# Patient Record
Sex: Male | Born: 1955 | Race: White | Hispanic: No
Health system: Midwestern US, Academic
[De-identification: ages and names within clinical notes are randomized; demographics above are authoritative.]

---

## 2016-11-04 ENCOUNTER — Encounter: Admit: 2016-11-04 | Discharge: 2016-11-04 | Payer: MEDICARE

## 2016-11-04 MED ORDER — EZETIMIBE 10 MG PO TAB
ORAL_TABLET | Freq: Every day | 1 refills | Status: AC
Start: 2016-11-04 — End: 2016-11-11

## 2016-11-04 MED ORDER — CARVEDILOL 12.5 MG PO TAB
12.5 mg | ORAL_TABLET | Freq: Two times a day (BID) | ORAL | 3 refills | 90.00000 days | Status: AC
Start: 2016-11-04 — End: 2016-12-26

## 2016-11-04 NOTE — Telephone Encounter
Received a fax from the patients local pharmacy requesting a refill. Rx sent as requested by the local pharmacy

## 2016-11-06 ENCOUNTER — Encounter: Admit: 2016-11-06 | Discharge: 2016-11-06 | Payer: MEDICARE

## 2016-11-11 ENCOUNTER — Encounter: Admit: 2016-11-11 | Discharge: 2016-11-11 | Payer: MEDICARE

## 2016-11-11 MED ORDER — EZETIMIBE 10 MG PO TAB
ORAL_TABLET | Freq: Every day | 1 refills | Status: AC
Start: 2016-11-11 — End: 2017-08-06

## 2016-11-12 LAB — COMPREHENSIVE METABOLIC PANEL
Lab: 142 — ABNORMAL LOW (ref 4.70–6.10)
Lab: 15
Lab: 15 — ABNORMAL HIGH (ref 0–14)
Lab: 18
Lab: 37
Lab: 42

## 2016-11-12 LAB — CBC: Lab: 5.4

## 2016-11-12 LAB — LIPID PROFILE
Lab: 210 — ABNORMAL HIGH (ref 150–200)
Lab: 50

## 2016-11-12 LAB — HEMOGLOBIN A1C: Lab: 7.6 — ABNORMAL HIGH (ref 4.5–6.5)

## 2016-12-10 ENCOUNTER — Encounter: Admit: 2016-12-10 | Discharge: 2016-12-10 | Payer: MEDICARE

## 2016-12-10 NOTE — Telephone Encounter
-----   Message from Rosaria Ferries, LPN sent at 09/15/3974  2:41 PM CDT -----  Regarding: Remote check  Patient has follow up appointment on 12/26/16 in [place] with Dr. Tyson Alias. Patient's last remote check was February 2018. Can we get a current remote check on this patient before the scheduled follow up appointment? Thank you!

## 2016-12-10 NOTE — Telephone Encounter
Pt has been having problems with his home device transmitter.  He has been working with the company and they have made the decision to replace the machine.  It will take about 7-10 days and he wanted us to know.     I told him I would notify the device team so they are aware.

## 2016-12-10 NOTE — Telephone Encounter
Contacted pt for disconnected monitor list. A letter was sent 11/06/16 with no response. [facility]  [facility]. Pt discovered that his monitor has been unplugged. Re plugged in and will send in a transmission after the handset recharges. Dates shows 07/04/16 on monitor. Asked him to call after he had sent in a transmission. He has printed instructions as well as on screen prompts. I gave him [employer] tech support # [phone] if has trouble sending in. He will call [phone] to let us know when it is sent in.

## 2016-12-17 ENCOUNTER — Ambulatory Visit: Admit: 2016-12-17 | Discharge: 2016-12-17 | Payer: MEDICARE

## 2016-12-17 ENCOUNTER — Encounter: Admit: 2016-12-17 | Discharge: 2016-12-17 | Payer: MEDICARE

## 2016-12-17 DIAGNOSIS — Z95810 Presence of automatic (implantable) cardiac defibrillator: ICD-10-CM

## 2016-12-17 DIAGNOSIS — I426 Alcoholic cardiomyopathy: ICD-10-CM

## 2016-12-26 ENCOUNTER — Encounter: Admit: 2016-12-26 | Discharge: 2016-12-26 | Payer: MEDICARE

## 2016-12-26 ENCOUNTER — Ambulatory Visit: Admit: 2016-12-26 | Discharge: 2016-12-27 | Payer: MEDICARE

## 2016-12-26 DIAGNOSIS — I10 Essential (primary) hypertension: ICD-10-CM

## 2016-12-26 DIAGNOSIS — E785 Hyperlipidemia, unspecified: ICD-10-CM

## 2016-12-26 DIAGNOSIS — E119 Type 2 diabetes mellitus without complications: ICD-10-CM

## 2016-12-26 DIAGNOSIS — I509 Heart failure, unspecified: Secondary | ICD-10-CM

## 2016-12-26 DIAGNOSIS — R569 Unspecified convulsions: ICD-10-CM

## 2016-12-26 DIAGNOSIS — G988 Other disorders of nervous system: ICD-10-CM

## 2016-12-26 DIAGNOSIS — Z95810 Presence of automatic (implantable) cardiac defibrillator: Principal | ICD-10-CM

## 2016-12-26 DIAGNOSIS — E349 Endocrine disorder, unspecified: ICD-10-CM

## 2016-12-26 DIAGNOSIS — G40909 Epilepsy, unspecified, not intractable, without status epilepticus: ICD-10-CM

## 2016-12-26 DIAGNOSIS — I426 Alcoholic cardiomyopathy: ICD-10-CM

## 2016-12-26 DIAGNOSIS — Z950 Presence of cardiac pacemaker: ICD-10-CM

## 2016-12-26 DIAGNOSIS — N183 Chronic kidney disease, stage 3 (moderate): ICD-10-CM

## 2016-12-26 DIAGNOSIS — I2510 Atherosclerotic heart disease of native coronary artery without angina pectoris: ICD-10-CM

## 2016-12-26 DIAGNOSIS — G4730 Sleep apnea, unspecified: ICD-10-CM

## 2016-12-26 DIAGNOSIS — F329 Major depressive disorder, single episode, unspecified: ICD-10-CM

## 2016-12-26 MED ORDER — LOSARTAN 25 MG PO TAB
12.5 mg | ORAL_TABLET | Freq: Every day | ORAL | 3 refills | 30.00000 days | Status: AC
Start: 2016-12-26 — End: 2016-12-27

## 2016-12-26 MED ORDER — CARVEDILOL 12.5 MG PO TAB
6.25 mg | ORAL_TABLET | Freq: Two times a day (BID) | ORAL | 3 refills | 90.00000 days | Status: AC
Start: 2016-12-26 — End: 2016-12-27

## 2016-12-26 NOTE — Assessment & Plan Note
Lab Results   Component Value Date    CHOL 210 (H) 11/12/2016    TRIG 187 11/12/2016    HDL 50 11/12/2016    LDL 106 (H) 11/12/2016    VLDL 37 11/12/2016    CHOLHDLC 4 11/12/2016      LDL pretty good on current medication.

## 2016-12-26 NOTE — Assessment & Plan Note
Remote transmission last week shows normal function, almost 100% bi-V pacing.

## 2016-12-26 NOTE — Assessment & Plan Note
He's having trouble with postural light headedness and has fallen several times.  He fractured his left foot during one fall during the night.  I'm lowering his anti-hypertensive medication dosages.

## 2016-12-26 NOTE — Assessment & Plan Note
Last EF was 25% in early 2017.  No heart failure manifestations currently.

## 2016-12-26 NOTE — Progress Notes
Date of Service: 12/26/2016    Manuel Houston is a 61 y.o. male.       HPI     Manuel Houston was in the [facility] clinic today for follow-up regarding cardiomyopathy and bi-V ICD.  He has had recurring problems with postural light headedness and has had several falls.  I'm going to lower his BP medication dosages.    His device check last week doesn't indicate any arrhythmias.  Device function is normal with nearly 100% bi-V pacing.    He denies any chest pain, dyspnea, or palpitations.  He hasn't had syncope.         Vitals:    12/26/16 0835 12/26/16 0844   BP: 116/70 110/68   Pulse: 96    Weight: 73.9 kg (163 lb)    Height: 1.676 m (5' 6)      Body mass index is 26.31 kg/m???.     Past Medical History  Patient Active Problem List    Diagnosis Date Noted   ??? Right arm pain 09/20/2015   ??? Weight loss 06/15/2015   ??? CKD (chronic kidney disease) stage 3, GFR 30-59 ml/min (HCC) 11/07/2010   ??? Carotid atherosclerosis 05/01/2010     03/22/10 - Carotid Duplex:  Minimal plaque in the internal carotid arteries bilaterally.  (RICA 1.61 m/sec, LICA 0.83 m/sec)     ??? 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        1998 - Possible coronary arteriogram, [facility] (no records, questionable historian)        10/05 - Echo doppler: EF 25%, LVH, inferior akinesis, mild MR, bi-atrial enlargement.         2/06 - Permanent disability status pending.        4/06 - EF 10-15% via left ventriculogram.  01/2015 - RVG EF 36%     ??? Coronary atherosclerosis 03/27/2009       3/06 - Stress thallium.        4/06 - Cardiac cath:  dilated cardiomyopathy, minimal mid-distal LAD plaquing. EF 10-15%.     ??? 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        2004 - Upgrade to biventricular device: Medtronic InSync III Phylliss Bob, [facility]).  2012 - Upgrade to bi-V ICD     ??? Essential hypertension 03/27/2009 2002 - Diagnosis established.     ??? Hyperlipidemia 03/27/2009   ??? Sleep apnea 03/27/2009      2002 - CPAP machine prescribed     ??? DM type 2 (diabetes mellitus, type 2) (HCC) 03/27/2009      2002 - Diagnosis established.        Followed by Dr. Threasa Beards in [place]     ??? Glaucoma 03/27/2009     1996 - Left eye removed surgically.     ??? Seizure disorder (HCC) 03/27/2009     Idiopathic Seizure Disorder       2002 - Onset.       12/05 - Last seizure (can't operate automobile).       Followed by Dr. Sharon Seller (neurologist in [place]).     ??? 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   HENT: Negative.  Eyes: Negative.    Cardiovascular: Negative.    Respiratory: Negative.    Endocrine: Negative.    Hematologic/Lymphatic: Negative.    Skin: Negative.    Musculoskeletal: Negative.    Gastrointestinal: Negative.    Genitourinary: Negative.    Neurological: Negative.    Psychiatric/Behavioral: Negative.    Allergic/Immunologic: Negative.        Physical Exam    Physical Exam   General Appearance: no distress   Skin: warm, no ulcers or xanthomas   Digits and Nails: no cyanosis or clubbing   Eyes: conjunctivae and lids normal, pupils are equal and round   Teeth/Gums/Palate: dentition unremarkable, no lesions   Lips & Oral Mucosa: no pallor or cyanosis   Neck Veins: normal JVP , neck veins are not distended   Thyroid: no nodules, masses, tenderness or enlargement   Chest Inspection: chest is normal in appearance   Respiratory Effort: breathing comfortably, no respiratory distress   Auscultation/Percussion: lungs clear to auscultation, no rales or rhonchi, no wheezing   PMI: PMI not enlarged or displaced   Cardiac Rhythm: regular rhythm and normal rate   Cardiac Auscultation: S1, S2 normal, no rub, no gallop   Murmurs: no murmur Peripheral Circulation: normal peripheral circulation   Carotid Arteries: normal carotid upstroke bilaterally, no bruits   Radial Arteries: normal symmetric radial pulses   Abdominal Aorta: no abdominal aortic bruit   Pedal Pulses: normal symmetric pedal pulses   Lower Extremity Edema: no lower extremity edema   Abdominal Exam: soft, non-tender, no masses, bowel sounds normal   Liver & Spleen: no organomegaly   Gait & Station: walks without assistance   Muscle Strength: normal muscle tone   Orientation: oriented to time, place and person   Affect & Mood: appropriate and sustained affect   Language and Memory: patient responsive and seems to comprehend information   Neurologic Exam: neurological assessment grossly intact   Other: moves all extremities        Problems Addressed Today  Encounter Diagnoses   Name Primary?   ??? Biventricular implantable cardioverter-defibrillator in situ    ??? Alcoholic cardiomyopathy (HCC)    ??? Hyperlipidemia, unspecified hyperlipidemia type    ??? Essential hypertension        Assessment and Plan       Biventricular implantable cardioverter-defibrillator in situ  Remote transmission last week shows normal function, almost 100% bi-V pacing.    Alcoholic cardiomyopathy (HCC)  Last EF was 25% in early 2017.  No heart failure manifestations currently.    Hyperlipidemia  Lab Results   Component Value Date    CHOL 210 (H) 11/12/2016    TRIG 187 11/12/2016    HDL 50 11/12/2016    LDL 106 (H) 11/12/2016    VLDL 37 11/12/2016    CHOLHDLC 4 11/12/2016      LDL pretty good on current medication.    Essential hypertension  He's having trouble with postural light headedness and has fallen several times.  He fractured his left foot during one fall during the night.  I'm lowering his anti-hypertensive medication dosages.      Current Medications (including today's revisions)  ??? allopurinol (ZYLOPRIM) 300 mg tablet Take 300 mg by mouth daily.     ??? aspirin EC 325 mg tablet Take 1 Tab by mouth daily. ??? carvedilol (COREG) 12.5 mg tablet Take one-half tablet by mouth twice daily.   ??? cholecalciferol(+) (VITAMIN D-3) 2,000 unit tablet Take  by mouth at bedtime daily.   ???  colchicine 0.6 mg tablet Take 0.6 mg by mouth daily.   ??? diclofenac sodium (VOLTAREN-XR) 100 mg xr tablet Take 1 tablet by mouth daily.   ??? duloxetine DR (CYMBALTA) 60 mg capsule Take 60 mg by mouth daily.   ??? eszopiclone(+) (LUNESTA) 1 mg tablet Take 1 mg by mouth.   ??? ezetimibe (ZETIA) 10 mg tablet Take one tablet by mouth once daily with simvastatin   ??? furosemide (LASIX) 40 mg PO Tab take 40 mg by mouth Daily.    ??? insulin detemir(+) (LEVEMIR) 100 unit/mL soln Inject 15 Units under the skin daily.   ??? levothyroxine (SYNTHROID) 50 mcg tablet Take 50 mcg by mouth daily.   ??? liraglutide(+) (VICTOZA) 0.6 mg/0.1 mL (18 mg/3 mL) pnij Inject 0.6 mg under the skin daily.   ??? losartan (COZAAR) 25 mg tablet Take one-half tablet by mouth daily.   ??? magnesium oxide (MAG-OX) 400 mg tablet Take 1 tablet by mouth twice daily.   ??? melatonin(+) 3 mg PO Tab Take 6 mg by mouth at bedtime daily.   ??? Omega-3 Fatty Acids-Vitamin E (FISH OIL) 1,000 mg PO Cap Take  by mouth Twice Daily.   ??? oxyCODONE-acetaminophen (PERCOCET; ENDOCET; ROXICET) 5-325 mg tablet Take 1-2 Tabs by mouth every 4 hours as needed for Pain   ??? phenytoin SR (DILANTIN EXTENDED) 100 mg capsule Take 100mg  by mouth twice daily alternating with 100mg  by mouth in the morning and 200mg  in the evening every other day.   ??? potassium chloride SR (K-DUR) 10 mEq tablet Take 10 mEq by mouth twice daily.     ??? sertraline (ZOLOFT) 100 mg PO Tab take 100 mg by mouth At Bedtime Daily.    ??? simvastatin (ZOCOR) 80 mg tablet Take 1 tablet by mouth at bedtime daily.   ??? topiramate (TOPAMAX) 50 mg PO tablet take 150 mg by mouth Twice Daily.    ??? zolpidem (AMBIEN) 10 mg tablet Take 10 mg by mouth at bedtime as needed for Sleep.

## 2016-12-27 ENCOUNTER — Encounter: Admit: 2016-12-27 | Discharge: 2016-12-27 | Payer: MEDICARE

## 2016-12-27 MED ORDER — LOSARTAN 25 MG PO TAB
12.5 mg | ORAL_TABLET | Freq: Every day | ORAL | 3 refills | 30.00000 days | Status: AC
Start: 2016-12-27 — End: 2017-02-18

## 2016-12-27 MED ORDER — CARVEDILOL 6.25 MG PO TAB
6.25 mg | ORAL_TABLET | Freq: Two times a day (BID) | ORAL | 3 refills | 90.00000 days | Status: AC
Start: 2016-12-27 — End: 2017-02-18

## 2017-02-18 ENCOUNTER — Encounter: Admit: 2017-02-18 | Discharge: 2017-02-18 | Payer: MEDICARE

## 2017-02-18 MED ORDER — LOSARTAN 25 MG PO TAB
12.5 mg | ORAL_TABLET | Freq: Every day | ORAL | 3 refills | 30.00000 days | Status: AC
Start: 2017-02-18 — End: 2017-07-17

## 2017-02-18 MED ORDER — CARVEDILOL 6.25 MG PO TAB
6.25 mg | ORAL_TABLET | Freq: Two times a day (BID) | ORAL | 3 refills | 90.00000 days | Status: AC
Start: 2017-02-18 — End: 2017-08-06

## 2017-02-19 ENCOUNTER — Encounter: Admit: 2017-02-19 | Discharge: 2017-02-19 | Payer: MEDICARE

## 2017-02-19 NOTE — Telephone Encounter
Spoke to [employer] at [employer] [id number]) re: reference # [id number] [phone] to clarify doses of carvedilol and losartan. Both medications were reduced at last OV with Dr. Danella Maiers [facility]) on 12/26/16. Read back verification of current doses completed with pharmacist [facility]. No further needs identified at this time.

## 2017-03-19 ENCOUNTER — Ambulatory Visit: Admit: 2017-03-19 | Discharge: 2017-03-20 | Payer: MEDICARE

## 2017-03-19 DIAGNOSIS — I426 Alcoholic cardiomyopathy: Principal | ICD-10-CM

## 2017-03-20 DIAGNOSIS — Z95810 Presence of automatic (implantable) cardiac defibrillator: ICD-10-CM

## 2017-05-07 ENCOUNTER — Encounter: Admit: 2017-05-07 | Discharge: 2017-05-07 | Payer: MEDICARE

## 2017-05-07 NOTE — Telephone Encounter
Pt had general questions about home monitoring. All questions answered and pt happy with f/u.

## 2017-05-07 NOTE — Telephone Encounter
-----   Message from Tyrone SageMeghan Adams sent at 05/07/2017  9:34 AM CST -----  Regarding: Pt is requesting a call  Pt has a question and is requesting a call back.  He can be reached at [phone]. thanks

## 2017-05-09 LAB — BASIC METABOLIC PANEL
Lab: 1.8 — ABNORMAL HIGH (ref 0.72–1.25)
Lab: 10 — ABNORMAL HIGH (ref 8.8–10.0)
Lab: 100 — ABNORMAL LOW (ref 42.0–52.0)
Lab: 143 — ABNORMAL LOW (ref 4.70–6.10)
Lab: 18 — ABNORMAL HIGH (ref 0–14)
Lab: 187 — ABNORMAL HIGH (ref 80–115)
Lab: 27 — ABNORMAL HIGH (ref 8.4–25.7)
Lab: 29
Lab: 4.4 — ABNORMAL LOW (ref 14.0–18.0)

## 2017-05-09 LAB — CBC: Lab: 4.9

## 2017-06-18 ENCOUNTER — Ambulatory Visit: Admit: 2017-06-18 | Discharge: 2017-06-19 | Payer: MEDICARE

## 2017-06-18 DIAGNOSIS — I426 Alcoholic cardiomyopathy: Principal | ICD-10-CM

## 2017-06-19 DIAGNOSIS — Z95810 Presence of automatic (implantable) cardiac defibrillator: ICD-10-CM

## 2017-07-01 ENCOUNTER — Encounter: Admit: 2017-07-01 | Discharge: 2017-07-01 | Payer: MEDICARE

## 2017-07-17 ENCOUNTER — Ambulatory Visit: Admit: 2017-07-17 | Discharge: 2017-07-18 | Payer: MEDICARE

## 2017-07-17 ENCOUNTER — Encounter: Admit: 2017-07-17 | Discharge: 2017-07-17 | Payer: MEDICARE

## 2017-07-17 DIAGNOSIS — I509 Heart failure, unspecified: Secondary | ICD-10-CM

## 2017-07-17 DIAGNOSIS — G4730 Sleep apnea, unspecified: ICD-10-CM

## 2017-07-17 DIAGNOSIS — F329 Major depressive disorder, single episode, unspecified: ICD-10-CM

## 2017-07-17 DIAGNOSIS — E785 Hyperlipidemia, unspecified: ICD-10-CM

## 2017-07-17 DIAGNOSIS — Z95810 Presence of automatic (implantable) cardiac defibrillator: Principal | ICD-10-CM

## 2017-07-17 DIAGNOSIS — I2510 Atherosclerotic heart disease of native coronary artery without angina pectoris: Secondary | ICD-10-CM

## 2017-07-17 DIAGNOSIS — I10 Essential (primary) hypertension: ICD-10-CM

## 2017-07-17 DIAGNOSIS — G988 Other disorders of nervous system: ICD-10-CM

## 2017-07-17 DIAGNOSIS — E119 Type 2 diabetes mellitus without complications: ICD-10-CM

## 2017-07-17 DIAGNOSIS — I426 Alcoholic cardiomyopathy: ICD-10-CM

## 2017-07-17 DIAGNOSIS — G40909 Epilepsy, unspecified, not intractable, without status epilepticus: ICD-10-CM

## 2017-07-17 DIAGNOSIS — R569 Unspecified convulsions: ICD-10-CM

## 2017-07-17 DIAGNOSIS — N183 Chronic kidney disease, stage 3 (moderate): ICD-10-CM

## 2017-07-17 DIAGNOSIS — E349 Endocrine disorder, unspecified: ICD-10-CM

## 2017-07-17 DIAGNOSIS — Z950 Presence of cardiac pacemaker: ICD-10-CM

## 2017-07-17 MED ORDER — ASPIRIN 81 MG PO TBEC
81 mg | ORAL_TABLET | Freq: Every day | ORAL | 3 refills | Status: AC
Start: 2017-07-17 — End: ?

## 2017-07-17 MED ORDER — CANDESARTAN 4 MG PO TAB
4 mg | ORAL_TABLET | Freq: Every day | ORAL | 3 refills | Status: AC
Start: 2017-07-17 — End: 2017-08-06

## 2017-08-06 ENCOUNTER — Encounter: Admit: 2017-08-06 | Discharge: 2017-08-06 | Payer: MEDICARE

## 2017-08-06 MED ORDER — EZETIMIBE 10 MG PO TAB
ORAL_TABLET | Freq: Every day | 3 refills | Status: AC
Start: 2017-08-06 — End: 2018-06-15

## 2017-08-06 MED ORDER — CARVEDILOL 6.25 MG PO TAB
6.25 mg | ORAL_TABLET | Freq: Two times a day (BID) | ORAL | 3 refills | 90.00000 days | Status: AC
Start: 2017-08-06 — End: 2018-06-15

## 2017-08-06 MED ORDER — MAGNESIUM OXIDE 400 MG (241.3 MG MAGNESIUM) PO TAB
400 mg | ORAL_TABLET | Freq: Two times a day (BID) | ORAL | 3 refills | Status: AC
Start: 2017-08-06 — End: 2018-06-15

## 2017-08-06 MED ORDER — SIMVASTATIN 80 MG PO TAB
80 mg | ORAL_TABLET | Freq: Every evening | ORAL | 3 refills | Status: AC
Start: 2017-08-06 — End: 2018-06-15

## 2017-08-06 MED ORDER — CANDESARTAN 4 MG PO TAB
4 mg | ORAL_TABLET | Freq: Every day | ORAL | 3 refills | Status: AC
Start: 2017-08-06 — End: 2018-06-15

## 2017-08-14 ENCOUNTER — Encounter: Admit: 2017-08-14 | Discharge: 2017-08-14 | Payer: MEDICARE

## 2017-08-14 DIAGNOSIS — G4730 Sleep apnea, unspecified: ICD-10-CM

## 2017-08-14 DIAGNOSIS — I10 Essential (primary) hypertension: Secondary | ICD-10-CM

## 2017-08-14 DIAGNOSIS — E349 Endocrine disorder, unspecified: ICD-10-CM

## 2017-08-14 DIAGNOSIS — E785 Hyperlipidemia, unspecified: ICD-10-CM

## 2017-08-14 DIAGNOSIS — I509 Heart failure, unspecified: Secondary | ICD-10-CM

## 2017-08-14 DIAGNOSIS — G40909 Epilepsy, unspecified, not intractable, without status epilepticus: ICD-10-CM

## 2017-08-14 DIAGNOSIS — R569 Unspecified convulsions: ICD-10-CM

## 2017-08-14 DIAGNOSIS — F329 Major depressive disorder, single episode, unspecified: ICD-10-CM

## 2017-08-14 DIAGNOSIS — G988 Other disorders of nervous system: ICD-10-CM

## 2017-08-14 DIAGNOSIS — N183 Chronic kidney disease, stage 3 (moderate): ICD-10-CM

## 2017-08-14 DIAGNOSIS — E119 Type 2 diabetes mellitus without complications: ICD-10-CM

## 2017-08-14 DIAGNOSIS — Z950 Presence of cardiac pacemaker: ICD-10-CM

## 2017-08-14 DIAGNOSIS — I2510 Atherosclerotic heart disease of native coronary artery without angina pectoris: Principal | ICD-10-CM

## 2017-09-17 ENCOUNTER — Ambulatory Visit: Admit: 2017-09-17 | Discharge: 2017-09-18 | Payer: MEDICARE

## 2017-09-18 DIAGNOSIS — Z95810 Presence of automatic (implantable) cardiac defibrillator: ICD-10-CM

## 2017-09-18 DIAGNOSIS — I426 Alcoholic cardiomyopathy: Principal | ICD-10-CM

## 2017-12-17 ENCOUNTER — Ambulatory Visit: Admit: 2017-12-17 | Discharge: 2017-12-18 | Payer: MEDICARE

## 2017-12-18 DIAGNOSIS — Z95810 Presence of automatic (implantable) cardiac defibrillator: ICD-10-CM

## 2017-12-18 DIAGNOSIS — I426 Alcoholic cardiomyopathy: Principal | ICD-10-CM

## 2017-12-23 LAB — LIPID PROFILE
Lab: 134
Lab: 153 — ABNORMAL LOW (ref 42.0–52.0)
Lab: 27 — ABNORMAL HIGH (ref 80–115)
Lab: 3
Lab: 50
Lab: 66 — ABNORMAL HIGH (ref 0.72–1.25)

## 2017-12-23 LAB — HEMOGLOBIN A1C: Lab: 6.8 — ABNORMAL HIGH (ref 4.5–6.5)

## 2017-12-23 LAB — CBC: Lab: 5.2

## 2017-12-23 LAB — COMPREHENSIVE METABOLIC PANEL
Lab: 0.3
Lab: 144 — ABNORMAL LOW (ref 4.70–6.10)

## 2018-01-22 ENCOUNTER — Encounter: Admit: 2018-01-22 | Discharge: 2018-01-22 | Payer: MEDICARE

## 2018-03-18 ENCOUNTER — Ambulatory Visit: Admit: 2018-03-18 | Discharge: 2018-03-19 | Payer: MEDICARE

## 2018-03-19 DIAGNOSIS — Z95810 Presence of automatic (implantable) cardiac defibrillator: Principal | ICD-10-CM

## 2018-03-19 DIAGNOSIS — I426 Alcoholic cardiomyopathy: ICD-10-CM

## 2018-06-15 ENCOUNTER — Encounter: Admit: 2018-06-15 | Discharge: 2018-06-15 | Payer: MEDICARE

## 2018-06-15 MED ORDER — SIMVASTATIN 80 MG PO TAB
80 mg | ORAL_TABLET | Freq: Every evening | ORAL | 3 refills | Status: AC
Start: 2018-06-15 — End: 2018-06-18

## 2018-06-15 MED ORDER — MAGNESIUM OXIDE 400 MG (241.3 MG MAGNESIUM) PO TAB
400 mg | ORAL_TABLET | Freq: Two times a day (BID) | ORAL | 3 refills | Status: AC
Start: 2018-06-15 — End: 2019-05-28

## 2018-06-15 MED ORDER — CARVEDILOL 6.25 MG PO TAB
6.25 mg | ORAL_TABLET | Freq: Two times a day (BID) | ORAL | 3 refills | 90.00000 days | Status: AC
Start: 2018-06-15 — End: 2019-05-19

## 2018-06-15 MED ORDER — CANDESARTAN 4 MG PO TAB
4 mg | ORAL_TABLET | Freq: Every day | ORAL | 3 refills | Status: AC
Start: 2018-06-15 — End: 2018-10-29

## 2018-06-15 MED ORDER — EZETIMIBE 10 MG PO TAB
ORAL_TABLET | Freq: Every day | 3 refills | Status: AC
Start: 2018-06-15 — End: 2019-05-19

## 2018-06-17 ENCOUNTER — Ambulatory Visit: Admit: 2018-06-17 | Discharge: 2018-06-17 | Payer: MEDICARE

## 2018-06-17 DIAGNOSIS — I426 Alcoholic cardiomyopathy: Principal | ICD-10-CM

## 2018-06-17 DIAGNOSIS — Z95810 Presence of automatic (implantable) cardiac defibrillator: ICD-10-CM

## 2018-06-18 ENCOUNTER — Encounter: Admit: 2018-06-18 | Discharge: 2018-06-18 | Payer: MEDICARE

## 2018-06-18 MED ORDER — SIMVASTATIN 80 MG PO TAB
80 mg | ORAL_TABLET | Freq: Every evening | ORAL | 3 refills | Status: AC
Start: 2018-06-18 — End: 2018-07-29

## 2018-06-23 ENCOUNTER — Ambulatory Visit: Admit: 2018-06-23 | Discharge: 2018-06-24 | Payer: MEDICARE

## 2018-06-23 ENCOUNTER — Encounter: Admit: 2018-06-23 | Discharge: 2018-06-23 | Payer: MEDICARE

## 2018-06-23 DIAGNOSIS — Z95810 Presence of automatic (implantable) cardiac defibrillator: Principal | ICD-10-CM

## 2018-06-23 DIAGNOSIS — I426 Alcoholic cardiomyopathy: ICD-10-CM

## 2018-07-14 ENCOUNTER — Encounter: Admit: 2018-07-14 | Discharge: 2018-07-14 | Payer: MEDICARE

## 2018-07-27 ENCOUNTER — Encounter: Admit: 2018-07-27 | Discharge: 2018-07-27 | Payer: MEDICARE

## 2018-07-27 NOTE — Telephone Encounter
[facility] [place] team - please contact pt. Was scheduled for [facility] on 3[place]17 [place], [facility]ens not in clinic, rescheduled to 4/16 instead - please call - has device questions.

## 2018-07-29 ENCOUNTER — Encounter: Admit: 2018-07-29 | Discharge: 2018-07-29 | Payer: MEDICARE

## 2018-07-29 MED ORDER — SIMVASTATIN 80 MG PO TAB
80 mg | ORAL_TABLET | Freq: Every evening | ORAL | 1 refills | Status: AC
Start: 2018-07-29 — End: 2018-08-28

## 2018-07-29 NOTE — Telephone Encounter
Called and left message requesting callback to answer questions.

## 2018-08-18 ENCOUNTER — Encounter: Admit: 2018-08-18 | Discharge: 2018-08-18 | Payer: MEDICARE

## 2018-08-19 ENCOUNTER — Encounter: Admit: 2018-08-19 | Discharge: 2018-08-19 | Payer: MEDICARE

## 2018-08-21 ENCOUNTER — Encounter: Admit: 2018-08-21 | Discharge: 2018-08-21 | Payer: MEDICARE

## 2018-08-21 MED ORDER — ISOSORBIDE MONONITRATE 30 MG PO TB24
30 mg | ORAL_TABLET | Freq: Every morning | ORAL | 1 refills | 30.00000 days | Status: DC
Start: 2018-08-21 — End: 2018-10-29

## 2018-08-21 NOTE — Telephone Encounter
Pt called back and states that he is still having chest pain.  He states that it is up around his pacemaker.  He has not had taken any NTG lately, but says the pain is just kind of always there.  Discussed imdur with patient  He is willing to try it and see if it makes any difference.  He requested that we send in a weeks upply to his pharmacy and he will callback to let us know if the medication helps.      Sent script to pharmacy.

## 2018-08-21 NOTE — Telephone Encounter
Left message and requested callback to follow up on symptoms

## 2018-08-21 NOTE — Telephone Encounter
-----   Message from Weston Brass sent at 08/19/2018 12:46 PM CDT -----  Regarding: Call Friday  Please call patient and ask about chest discomfort see below.    We left it that I'd ask Brett Canales to check back with Manuel Houston on Friday.  If he's still having occasionally episodes and especially if he's used a NTG with good response, we might add Imdur 30 mg/day to Jahmeir's medication program.

## 2018-08-24 ENCOUNTER — Encounter: Admit: 2018-08-24 | Discharge: 2018-08-24 | Payer: MEDICARE

## 2018-08-24 MED ORDER — OMEPRAZOLE 20 MG PO CPDR
ORAL_CAPSULE | Freq: Every morning | 1 refills | Status: DC
Start: 2018-08-24 — End: 2019-06-24

## 2018-08-24 NOTE — Telephone Encounter
Called patient back and left message with recommendations. Rx sent ot pharmacy.     Vanice Sarah, MD  Allen Norris, RN    Sure, let's have him start 40 mg/day omeprazole and, if it's helping, he can cut back to 20 mg/day in 2-3 weeks. ???Thanks.

## 2018-08-25 NOTE — Telephone Encounter
Pt called back to let us know he received message.  Patient will call back in about 7-10 days if omeprazole doesn't help symptoms. No further needs identified at this time.

## 2018-08-28 ENCOUNTER — Encounter: Admit: 2018-08-28 | Discharge: 2018-08-28 | Payer: MEDICARE

## 2018-08-28 MED ORDER — SIMVASTATIN 80 MG PO TAB
80 mg | ORAL_TABLET | Freq: Every evening | ORAL | 1 refills | Status: DC
Start: 2018-08-28 — End: 2019-02-27

## 2018-08-31 ENCOUNTER — Encounter: Admit: 2018-08-31 | Discharge: 2018-08-31 | Payer: MEDICARE

## 2018-09-02 ENCOUNTER — Encounter: Admit: 2018-09-02 | Discharge: 2018-09-02 | Payer: MEDICARE

## 2018-09-02 DIAGNOSIS — I426 Alcoholic cardiomyopathy: ICD-10-CM

## 2018-09-02 DIAGNOSIS — R079 Chest pain, unspecified: ICD-10-CM

## 2018-09-02 DIAGNOSIS — I10 Essential (primary) hypertension: Principal | ICD-10-CM

## 2018-09-08 ENCOUNTER — Ambulatory Visit: Admit: 2018-09-08 | Discharge: 2018-09-09 | Payer: MEDICARE

## 2018-09-08 ENCOUNTER — Encounter: Admit: 2018-09-08 | Discharge: 2018-09-08 | Payer: MEDICARE

## 2018-09-08 DIAGNOSIS — R0789 Other chest pain: Principal | ICD-10-CM

## 2018-09-10 ENCOUNTER — Encounter: Admit: 2018-09-10 | Discharge: 2018-09-10 | Payer: MEDICARE

## 2018-09-16 ENCOUNTER — Encounter: Admit: 2018-09-16 | Discharge: 2018-09-16 | Payer: MEDICARE

## 2018-09-16 ENCOUNTER — Ambulatory Visit: Admit: 2018-09-16 | Discharge: 2018-09-16 | Payer: MEDICARE

## 2018-09-16 DIAGNOSIS — I426 Alcoholic cardiomyopathy: Principal | ICD-10-CM

## 2018-09-16 DIAGNOSIS — Z95810 Presence of automatic (implantable) cardiac defibrillator: Secondary | ICD-10-CM

## 2018-10-29 ENCOUNTER — Ambulatory Visit: Admit: 2018-10-29 | Discharge: 2018-10-30

## 2018-10-29 ENCOUNTER — Encounter: Admit: 2018-10-29 | Discharge: 2018-10-29

## 2018-10-29 DIAGNOSIS — I2510 Atherosclerotic heart disease of native coronary artery without angina pectoris: Secondary | ICD-10-CM

## 2018-10-29 DIAGNOSIS — N183 Chronic kidney disease, stage 3 (moderate): Secondary | ICD-10-CM

## 2018-10-29 DIAGNOSIS — E119 Type 2 diabetes mellitus without complications: Secondary | ICD-10-CM

## 2018-10-29 DIAGNOSIS — F329 Major depressive disorder, single episode, unspecified: Secondary | ICD-10-CM

## 2018-10-29 DIAGNOSIS — I509 Heart failure, unspecified: Secondary | ICD-10-CM

## 2018-10-29 DIAGNOSIS — G988 Other disorders of nervous system: Secondary | ICD-10-CM

## 2018-10-29 DIAGNOSIS — I10 Essential (primary) hypertension: Secondary | ICD-10-CM

## 2018-10-29 DIAGNOSIS — Z95810 Presence of automatic (implantable) cardiac defibrillator: Secondary | ICD-10-CM

## 2018-10-29 DIAGNOSIS — I426 Alcoholic cardiomyopathy: Secondary | ICD-10-CM

## 2018-10-29 DIAGNOSIS — E785 Hyperlipidemia, unspecified: Secondary | ICD-10-CM

## 2018-10-29 DIAGNOSIS — G4730 Sleep apnea, unspecified: Secondary | ICD-10-CM

## 2018-10-29 DIAGNOSIS — G40909 Epilepsy, unspecified, not intractable, without status epilepticus: Secondary | ICD-10-CM

## 2018-10-29 DIAGNOSIS — Z950 Presence of cardiac pacemaker: Secondary | ICD-10-CM

## 2018-10-29 DIAGNOSIS — R569 Unspecified convulsions: Secondary | ICD-10-CM

## 2018-10-29 DIAGNOSIS — E349 Endocrine disorder, unspecified: Secondary | ICD-10-CM

## 2018-10-29 MED ORDER — CANDESARTAN 4 MG PO TAB
2 mg | ORAL_TABLET | Freq: Every day | ORAL | 3 refills | Status: DC
Start: 2018-10-29 — End: 2019-06-09

## 2018-10-29 NOTE — Assessment & Plan Note
Lab Results   Component Value Date    CHOL 156 06/18/2018    TRIG 134 06/18/2018    HDL 51 06/18/2018    LDL 63 06/18/2018    VLDL 27 06/18/2018    CHOLHDLC 3 06/18/2018      LDL treated to goal.

## 2018-10-29 NOTE — Assessment & Plan Note
Remote transmission a month ago shows normal function.  No therapies.

## 2018-10-29 NOTE — Assessment & Plan Note
EF 20% on RVG from April.  Non-ischemic MPI.  No HF manifestations currently.

## 2018-10-29 NOTE — Assessment & Plan Note
I'm reducing candesartan to 2 mg/day.

## 2018-10-29 NOTE — Progress Notes
Date of Service: 10/29/2018    Manuel Houston is a 63 y.o. male.       HPI     Manuel Houston was in the [place] clinic today for follow-up regarding cardiomyopathy and ICD.  From a cardiovascular standpoint he seems to be doing fine but I am concerned about the fact that he has had about a 10 to 15 pound unexplained weight loss.  He is on metformin and the dose is being worked up but he is not having diarrhea so I am not real sure that the metformin is the entire answer.  He is having a lot of pelvic discomfort and I think more clears is planning cystoscopy in the near future.  Manuel Houston's GI work-up seems to be negative so far and his upper endoscopy reportedly showed no particular pathology.    From a cardiac standpoint he denies any problems with chest discomfort or breathlessness.  He has had no palpitations or lightheadedness.  He had a remote transmission about a month ago that showed normal device function.  He has had real good benefit from biventricular pacing although his ejection fraction is still only 20% on RVG last April.         Vitals:    10/29/18 0848 10/29/18 0850   BP: 94/64 92/62   BP Source: Arm, Left Upper Arm, Right Upper   Pulse: 96    SpO2: 98%    Weight: 69.6 kg (153 lb 6.4 oz)    Height: 1.676 m (5' 6)    PainSc: Three      Body mass index is 24.76 kg/m???.     Past Medical History  Patient Active Problem List    Diagnosis Date Noted   ??? Right arm pain 09/20/2015   ??? Weight loss 06/15/2015   ??? CKD (chronic kidney disease) stage 3, GFR 30-59 ml/min (HCC) 11/07/2010   ??? Carotid atherosclerosis 05/01/2010     03/22/10 - Carotid Duplex:  Minimal plaque in the internal carotid arteries bilaterally.  (RICA 1.61 m/sec, LICA 0.83 m/sec)     ??? 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        1998 - Possible coronary arteriogram, [facility] (no records, questionable historian)        10/05 - Echo doppler: EF 25%, LVH, inferior akinesis, mild MR, bi-atrial enlargement.         2/06 - Permanent disability status pending.        4/06 - EF 10-15% via left ventriculogram.  01/2015 - RVG EF 36%     ??? Coronary atherosclerosis 03/27/2009       3/06 - Stress thallium.        4/06 - Cardiac cath:  dilated cardiomyopathy, minimal mid-distal LAD plaquing. EF 10-15%.     ??? 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        2004 - Upgrade to biventricular device: Medtronic InSync III Phylliss Bob, [facility]).  2012 - Upgrade to bi-V ICD     ??? Essential hypertension 03/27/2009       2002 - Diagnosis established.     ??? Hyperlipidemia 03/27/2009   ??? Sleep apnea 03/27/2009      2002 - CPAP machine prescribed     ??? DM type 2 (diabetes mellitus, type 2) (HCC) 03/27/2009      2002 - Diagnosis established.  Followed by Dr. Threasa Beards in [place]     ??? Glaucoma 03/27/2009     1996 - Left eye removed surgically.     ??? Seizure disorder (HCC) 03/27/2009     Idiopathic Seizure Disorder       2002 - Onset.       12/05 - Last seizure (can't operate automobile).       Followed by Dr. Sharon Seller (neurologist in [place]).     ??? 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   HENT: Negative.    Eyes: Negative.    Cardiovascular: Negative.    Respiratory: Negative.    Endocrine: Negative.    Hematologic/Lymphatic: Negative.    Skin: Negative.    Musculoskeletal: Negative.    Gastrointestinal: Negative.    Genitourinary: Negative.    Neurological: Negative.    Psychiatric/Behavioral: Negative.    Allergic/Immunologic: Negative.        Physical Exam    Physical Exam   General Appearance: no distress   Skin: warm, no ulcers or xanthomas   Digits and Nails: no cyanosis or clubbing Eyes: conjunctivae and lids normal, pupils are equal and round   Teeth/Gums/Palate: dentition unremarkable, no lesions   Lips & Oral Mucosa: no pallor or cyanosis   Neck Veins: normal JVP , neck veins are not distended   Thyroid: no nodules, masses, tenderness or enlargement   Chest Inspection: chest is normal in appearance   Respiratory Effort: breathing comfortably, no respiratory distress   Auscultation/Percussion: lungs clear to auscultation, no rales or rhonchi, no wheezing   PMI: PMI not enlarged or displaced   Cardiac Rhythm: regular rhythm and normal rate   Cardiac Auscultation: S1, S2 normal, no rub, no gallop   Murmurs: no murmur   Peripheral Circulation: normal peripheral circulation   Carotid Arteries: normal carotid upstroke bilaterally, no bruits   Radial Arteries: normal symmetric radial pulses   Abdominal Aorta: no abdominal aortic bruit   Pedal Pulses: normal symmetric pedal pulses   Lower Extremity Edema: no lower extremity edema   Abdominal Exam: soft, non-tender, no masses, bowel sounds normal   Liver & Spleen: no organomegaly   Gait & Station: walks without assistance   Muscle Strength: normal muscle tone   Orientation: oriented to time, place and person   Affect & Mood: appropriate and sustained affect   Language and Memory: patient responsive and seems to comprehend information   Neurologic Exam: neurological assessment grossly intact   Other: moves all extremities      Problems Addressed Today  Encounter Diagnoses   Name Primary?   ??? Hyperlipidemia, unspecified hyperlipidemia type    ??? Essential hypertension    ??? Alcoholic cardiomyopathy (HCC)    ??? Biventricular implantable cardioverter-defibrillator in situ        Assessment and Plan       Hyperlipidemia  Lab Results   Component Value Date    CHOL 156 06/18/2018    TRIG 134 06/18/2018    HDL 51 06/18/2018    LDL 63 06/18/2018    VLDL 27 06/18/2018    CHOLHDLC 3 06/18/2018      LDL treated to goal.    Essential hypertension I'm reducing candesartan to 2 mg/day.    Alcoholic cardiomyopathy (HCC)  EF 20% on RVG from  April.  Non-ischemic MPI.  No HF manifestations currently.    Biventricular implantable cardioverter-defibrillator in situ  Remote transmission a month ago shows normal function.  No therapies.      Current Medications (including today's revisions)  ??? allopurinol (ZYLOPRIM) 300 mg tablet Take 300 mg by mouth daily.     ??? aspirin EC 81 mg tablet Take one tablet by mouth daily. Take with food.   ??? calcium carbonate/vitamin D3 (CALCIUM 600 + D PO) Take  by mouth.   ??? candesartan (ATACAND) 4 mg tablet Take one-half tablet by mouth daily.   ??? carvediloL (COREG) 6.25 mg tablet Take one tablet by mouth twice daily. Take with food.   ??? cholecalciferol(+) (VITAMIN D-3) 2,000 unit tablet Take  by mouth at bedtime daily.   ??? diclofenac sodium (VOLTAREN-XR) 100 mg xr tablet Take 1 tablet by mouth daily.   ??? ezetimibe (ZETIA) 10 mg tablet Take one tablet by mouth once daily with simvastatin   ??? furosemide (LASIX) 40 mg PO Tab take 40 mg by mouth Daily.    ??? hydroxychloroquine (PLAQUENIL) 200 mg tablet Take 400 mg by mouth daily. Take with food.   ??? levothyroxine (SYNTHROID) 50 mcg tablet Take 50 mcg by mouth daily.   ??? liraglutide(+) (VICTOZA) 0.6 mg/0.1 mL (18 mg/3 mL) pnij Inject 0.6 mg under the skin daily.   ??? magnesium oxide (MAG-OX) 400 mg (241.3 mg magnesium) tablet Take one tablet by mouth twice daily.   ??? melatonin 10 mg tab Take 10 mg by mouth at bedtime daily.   ??? metFORMIN (GLUCOPHAGE) 1,000 mg tablet Take 1,000 mg by mouth twice daily with meals.   ??? Omega-3 Fatty Acids-Vitamin E (FISH OIL) 1,000 mg PO Cap Take  by mouth Twice Daily.   ??? omeprazole DR (PRILOSEC) 20 mg capsule Take 2 tabs every morning before breakfast for 2 weeks, then one tab daily thereafter.   ??? oxyCODONE-acetaminophen (PERCOCET; ENDOCET; ROXICET) 5-325 mg tablet Take 1-2 Tabs by mouth every 4 hours as needed for Pain ??? phenytoin SR (DILANTIN EXTENDED) 100 mg capsule Take 100mg  by mouth twice daily alternating with 100mg  by mouth in the morning and 200mg  in the evening every other day.   ??? potassium chloride (K-TAB) 20 mEq tablet Take 20 mEq by mouth twice daily.   ??? sertraline (ZOLOFT) 100 mg PO Tab Take 200 mg by mouth at bedtime daily.   ??? simvastatin (ZOCOR) 80 mg tablet Take one tablet by mouth at bedtime daily.   ??? topiramate (TOPAMAX) 50 mg PO tablet take 150 mg by mouth Twice Daily.    ??? zolpidem (AMBIEN) 10 mg tablet Take 10 mg by mouth at bedtime as needed for Sleep.

## 2018-12-16 ENCOUNTER — Encounter: Admit: 2018-12-16 | Discharge: 2018-12-16

## 2018-12-16 ENCOUNTER — Ambulatory Visit: Admit: 2018-12-16 | Discharge: 2018-12-17

## 2018-12-16 DIAGNOSIS — Z95810 Presence of automatic (implantable) cardiac defibrillator: Secondary | ICD-10-CM

## 2018-12-16 DIAGNOSIS — I426 Alcoholic cardiomyopathy: Secondary | ICD-10-CM

## 2018-12-29 ENCOUNTER — Encounter: Admit: 2018-12-29 | Discharge: 2018-12-29 | Payer: MEDICARE

## 2018-12-29 NOTE — Telephone Encounter
Called and discussed symptoms with patient.  He denies any weight gain.  He has noticed some shortness of breath when he has been vacuuming, but states that it is not that often.  He states that it might not be anything new.  He hasn't been using his sleep apnea machine at night as he states that it has been irritating his nose.  He will start wearing his sleep mask tonight.  He is scheduled to see Dr. Marcello Moores tomorrow for routine follow up.    Will route to Dr. Ricard Dillon for any additional recommendations.

## 2018-12-29 NOTE — Telephone Encounter
-----   Message from Buckner Malta sent at 12/29/2018 10:57 AM CDT -----  Regarding: Optivol suggesting fluid, SDO Patient  Good morning,    Patient had remote on 12/16/2018 that showed OptiVol elevating which may suggest some fluid accumulation.  Please see attached. Thank you.

## 2019-01-01 NOTE — Telephone Encounter
Manuel Cowboy, MD  Baldwin Crown, RN    Caller: Unspecified (3 days ago, 11:17 AM)              Probably nothing to do different. His bi-V pacing was almost 100% and he's on furosemide--suspect it's an OptiVol false alarm.

## 2019-02-01 ENCOUNTER — Encounter: Admit: 2019-02-01 | Discharge: 2019-02-01 | Payer: MEDICARE

## 2019-02-27 ENCOUNTER — Encounter: Admit: 2019-02-27 | Discharge: 2019-02-27 | Payer: MEDICARE

## 2019-02-27 MED ORDER — SIMVASTATIN 80 MG PO TAB
ORAL_TABLET | Freq: Every evening | 1 refills | Status: AC
Start: 2019-02-27 — End: ?

## 2019-04-23 ENCOUNTER — Encounter: Admit: 2019-04-23 | Discharge: 2019-04-23 | Payer: MEDICARE

## 2019-04-29 ENCOUNTER — Encounter: Admit: 2019-04-29 | Discharge: 2019-04-29 | Payer: MEDICARE

## 2019-04-29 DIAGNOSIS — I10 Essential (primary) hypertension: Secondary | ICD-10-CM

## 2019-04-29 DIAGNOSIS — G40909 Epilepsy, unspecified, not intractable, without status epilepticus: Secondary | ICD-10-CM

## 2019-04-29 DIAGNOSIS — I2510 Atherosclerotic heart disease of native coronary artery without angina pectoris: Secondary | ICD-10-CM

## 2019-04-29 DIAGNOSIS — G4730 Sleep apnea, unspecified: Secondary | ICD-10-CM

## 2019-04-29 DIAGNOSIS — R569 Unspecified convulsions: Secondary | ICD-10-CM

## 2019-04-29 DIAGNOSIS — Z950 Presence of cardiac pacemaker: Secondary | ICD-10-CM

## 2019-04-29 DIAGNOSIS — N183 CKD (chronic kidney disease) stage 3, GFR 30-59 ml/min: Secondary | ICD-10-CM

## 2019-04-29 DIAGNOSIS — E119 Type 2 diabetes mellitus without complications: Secondary | ICD-10-CM

## 2019-04-29 DIAGNOSIS — I5022 Chronic systolic (congestive) heart failure: Secondary | ICD-10-CM

## 2019-04-29 DIAGNOSIS — I426 Alcoholic cardiomyopathy: Secondary | ICD-10-CM

## 2019-04-29 DIAGNOSIS — F329 Major depressive disorder, single episode, unspecified: Secondary | ICD-10-CM

## 2019-04-29 DIAGNOSIS — G988 Other disorders of nervous system: Secondary | ICD-10-CM

## 2019-04-29 DIAGNOSIS — E785 Hyperlipidemia, unspecified: Secondary | ICD-10-CM

## 2019-04-29 DIAGNOSIS — Z95810 Presence of automatic (implantable) cardiac defibrillator: Secondary | ICD-10-CM

## 2019-04-29 DIAGNOSIS — I509 Heart failure, unspecified: Secondary | ICD-10-CM

## 2019-04-29 DIAGNOSIS — E349 Endocrine disorder, unspecified: Secondary | ICD-10-CM

## 2019-04-29 NOTE — Assessment & Plan Note
He had a nonischemic stress test in April.

## 2019-04-29 NOTE — Assessment & Plan Note
Ejection fraction was 20% on a regadenoson thallium in April, 2020.  He has a biventricular pacemaker that has significantly improved his symptoms status.  He is currently class II heart failure symptoms.

## 2019-04-29 NOTE — Assessment & Plan Note
Lab Results   Component Value Date    CHOL 156 06/18/2018    TRIG 134 06/18/2018    HDL 51 06/18/2018    LDL 63 06/18/2018    VLDL 27 06/18/2018    CHOLHDLC 3 06/18/2018      LDL treated to goal.

## 2019-04-29 NOTE — Progress Notes
Date of Service: 04/29/2019    Manuel Houston is a 63 y.o. male.       HPI     Manuel Houston was in the [place] [facility] clinic today for follow-up regarding ICD and systolic heart failure.  He's getting tired of the pandemic restrictions, but seems OK from a CV standpoint.      He gets tired some time when cleaning is place, but denies any chest discomfort or dyspnea.  He's using his BIPAP and doesn't sleep particularly well.  He takes 10 mg Ambien and says that it doesn't seem to make him sleepy!  He's also using melatonin.    His 60 and 74 year old grandchildren are coming over for him to watch 2 or 3 times/week.  His daughter is working at [employer], but after the virus went through the center.      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  He's going to see Dr. Aundria Rud for this.    Dr. Maisie Fus is managing his diabetes.  Manuel Houston does fingersticks and his levels have ranged from the 80's to 150's.  His renal function is stable at around creatinine = 2.  He sees Dr. Thornell Sartorius for anything male to be he's peeing alright.  I think he's following Gaetan's renal function as well.         Vitals:    04/29/19 0811 04/29/19 0814   BP: 102/76 104/74   BP Source: Arm, Left Upper Arm, Right Upper   Patient Position: Sitting Sitting   Pulse: 85    Temp: 36.4 ?C (97.5 ?F)    TempSrc: Oral    SpO2: 98%    Weight: 71 kg (156 lb 9.6 oz)    Height: 1.676 m (5' 6)    PainSc: Zero      Body mass index is 25.28 kg/m?Marland Kitchen     Past Medical History  Patient Active Problem List    Diagnosis Date Noted   ? Right arm pain 09/20/2015   ? Weight loss 06/15/2015   ? CKD (chronic kidney disease) stage 3, GFR 30-59 ml/min 11/07/2010   ? Carotid atherosclerosis 05/01/2010     03/22/10 - Carotid Duplex:  Minimal plaque in the internal carotid arteries bilaterally.  (RICA 5.78 m/sec, LICA 0.83 m/sec)     ? Alcoholic cardiomyopathy (HCC) 46/96/2952      1998 - Diagnosis after presentation with chest pain . 1998 - Possible coronary arteriogram, [facility] (no records, questionable historian)        10/05 - Echo doppler: EF 25%, LVH, inferior akinesis, mild MR, bi-atrial enlargement.         2/06 - Permanent disability status pending.        4/06 - EF 10-15% via left ventriculogram.  01/2015 - RVG EF 36%     ? Coronary atherosclerosis 03/27/2009       3/06 - Stress thallium.        4/06 - Cardiac cath:  dilated cardiomyopathy, minimal mid-distal LAD plaquing. EF 10-15%.     ? Biventricular implantable cardioverter-defibrillator in situ 03/27/2009       2002 - Permanent pacemaker: Phylliss Bob, [place]).        2004 - Upgrade to biventricular device: Medtronic InSync III Phylliss Bob, [facility]).  2012 - Upgrade to bi-V ICD     ? Essential hypertension 03/27/2009       2002 - Diagnosis established.     ?  Hyperlipidemia 03/27/2009   ? Sleep apnea 03/27/2009      2002 - CPAP machine prescribed     ? DM type 2 (diabetes mellitus, type 2) (HCC) 03/27/2009      2002 - Diagnosis established.        Followed by Dr. Threasa Beards in [place]     ? Glaucoma 03/27/2009     1996 - Left eye removed surgically.     ? Seizure disorder (HCC) 03/27/2009     Idiopathic Seizure Disorder       2002 - Onset.       12/05 - Last seizure (can't operate automobile).       Followed by Dr. Sharon Seller (neurologist in [place]).     ? 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   HENT: Negative.    Eyes: Negative.    Cardiovascular: Negative.    Respiratory: Positive for sleep disturbances due to breathing.    Endocrine: Negative.    Hematologic/Lymphatic: Negative.    Skin: Negative.    Musculoskeletal: Positive for arthritis, back pain, joint pain, muscle cramps, muscle weakness, myalgias, neck pain and stiffness.   Gastrointestinal: Negative.    Genitourinary: Negative.    Neurological: Negative. Psychiatric/Behavioral: The patient has insomnia.    Allergic/Immunologic: Negative.        Physical Exam    Physical Exam   General Appearance: no distress   Skin: warm, no ulcers or xanthomas   Digits and Nails: no cyanosis or clubbing   Eyes: conjunctivae and lids normal, pupils are equal and round   Teeth/Gums/Palate: dentition unremarkable, no lesions   Lips & Oral Mucosa: no pallor or cyanosis   Neck Veins: normal JVP , neck veins are not distended   Thyroid: no nodules, masses, tenderness or enlargement   Chest Inspection: chest is normal in appearance   Respiratory Effort: breathing comfortably, no respiratory distress   Auscultation/Percussion: lungs clear to auscultation, no rales or rhonchi, no wheezing   PMI: PMI not enlarged or displaced   Cardiac Rhythm: regular rhythm and normal rate   Cardiac Auscultation: S1, S2 normal, no rub, no gallop   Murmurs: no murmur   Peripheral Circulation: normal peripheral circulation   Carotid Arteries: normal carotid upstroke bilaterally, no bruits   Radial Arteries: normal symmetric radial pulses   Abdominal Aorta: no abdominal aortic bruit   Pedal Pulses: normal symmetric pedal pulses   Lower Extremity Edema: no lower extremity edema   Abdominal Exam: soft, non-tender, no masses, bowel sounds normal   Liver & Spleen: no organomegaly   Gait & Station: walks without assistance   Muscle Strength: normal muscle tone   Orientation: oriented to time, place and person   Affect & Mood: appropriate and sustained affect   Language and Memory: patient responsive and seems to comprehend information   Neurologic Exam: neurological assessment grossly intact   Other: moves all extremities      Problems Addressed Today  Encounter Diagnoses   Name Primary?   ? Chronic systolic heart failure (HCC) Yes   ? Biventricular implantable cardioverter-defibrillator in situ    ? Essential hypertension    ? Alcoholic cardiomyopathy (HCC)    ? Hyperlipidemia, unspecified hyperlipidemia type ? Atherosclerosis of coronary artery of native heart without angina pectoris, unspecified vessel or lesion type  Assessment and Plan       Essential hypertension  BP looks fine current medical program.    Alcoholic cardiomyopathy (HCC)  Ejection fraction was 20% on a regadenoson thallium in April, 2020.  He has a biventricular pacemaker that has significantly improved his symptoms status.  He is currently class II heart failure symptoms.    Hyperlipidemia  Lab Results   Component Value Date    CHOL 156 06/18/2018    TRIG 134 06/18/2018    HDL 51 06/18/2018    LDL 63 06/18/2018    VLDL 27 06/18/2018    CHOLHDLC 3 06/18/2018      LDL treated to goal.    Coronary atherosclerosis  He had a nonischemic stress test in April.    Biventricular implantable cardioverter-defibrillator in situ  He is due for an in office device check in February.  He had a remote transmission in August showing nearly 100% biventricular pacing.  He has a little over 2 years of expected battery longevity.      Current Medications (including today's revisions)  ? allopurinol (ZYLOPRIM) 300 mg tablet Take 300 mg by mouth daily.     ? aspirin EC 81 mg tablet Take one tablet by mouth daily. Take with food.   ? calcium carbonate/vitamin D3 (CALCIUM 600 + D PO) Take  by mouth twice daily.   ? candesartan (ATACAND) 4 mg tablet Take one-half tablet by mouth daily.   ? carvediloL (COREG) 6.25 mg tablet Take one tablet by mouth twice daily. Take with food.   ? cetirizine (ALLERGY RELIEF (CETIRIZINE)) 10 mg tablet Take 10 mg by mouth every morning.   ? cholecalciferol(+) (VITAMIN D-3) 2,000 unit tablet Take  by mouth at bedtime daily.   ? diclofenac (VOLTAREN) 1 % topical gel Apply 4 g topically to affected area three times daily.   ? ezetimibe (ZETIA) 10 mg tablet Take one tablet by mouth once daily with simvastatin   ? furosemide (LASIX) 40 mg PO Tab take 40 mg by mouth Daily. ? hydroxychloroquine (PLAQUENIL) 200 mg tablet Take 400 mg by mouth daily. Take with food.   ? levothyroxine (SYNTHROID) 50 mcg tablet Take 50 mcg by mouth daily.   ? liraglutide(+) (VICTOZA) 0.6 mg/0.1 mL (18 mg/3 mL) pnij Inject 1.2 mg under the skin daily.   ? magnesium oxide (MAG-OX) 400 mg (241.3 mg magnesium) tablet Take one tablet by mouth twice daily.   ? melatonin 10 mg tab Take 10 mg by mouth at bedtime daily.   ? Omega-3 Fatty Acids-Vitamin E (FISH OIL) 1,000 mg PO Cap Take 1,000 mg by mouth daily.   ? omeprazole DR (PRILOSEC) 20 mg capsule Take 2 tabs every morning before breakfast for 2 weeks, then one tab daily thereafter.   ? phenytoin SR (DILANTIN EXTENDED) 100 mg capsule Take 100mg  by mouth twice daily alternating with 100mg  by mouth in the morning and 200mg  in the evening every other day.   ? potassium chloride (K-TAB) 20 mEq tablet Take 20 mEq by mouth twice daily.   ? sertraline (ZOLOFT) 100 mg PO Tab Take 200 mg by mouth at bedtime daily.   ? simvastatin (ZOCOR) 80 mg tablet TAKE 1 TABLET AT BEDTIME   DAILY   ? topiramate (TOPAMAX) 50 mg PO tablet take 150 mg by mouth Twice Daily.    ? zolpidem (AMBIEN) 10 mg tablet Take 10 mg by mouth at bedtime as needed for Sleep.

## 2019-04-29 NOTE — Assessment & Plan Note
He is due for an in office device check in February.  He had a remote transmission in August showing nearly 100% biventricular pacing.  He has a little over 2 years of expected battery longevity.

## 2019-04-29 NOTE — Assessment & Plan Note
BP looks fine current medical program.

## 2019-05-19 MED ORDER — CARVEDILOL 6.25 MG PO TAB
ORAL_TABLET | Freq: Two times a day (BID) | ORAL | 3 refills | 90.00000 days | Status: AC
Start: 2019-05-19 — End: ?

## 2019-05-19 MED ORDER — EZETIMIBE 10 MG PO TAB
ORAL_TABLET | Freq: Every day | 3 refills | Status: AC
Start: 2019-05-19 — End: ?

## 2019-05-22 ENCOUNTER — Ambulatory Visit: Admit: 2019-05-22 | Discharge: 2019-05-22 | Payer: MEDICARE

## 2019-05-22 DIAGNOSIS — Z95810 Presence of automatic (implantable) cardiac defibrillator: Secondary | ICD-10-CM

## 2019-05-22 DIAGNOSIS — I5022 Chronic systolic (congestive) heart failure: Secondary | ICD-10-CM

## 2019-05-24 ENCOUNTER — Encounter: Admit: 2019-05-24 | Discharge: 2019-05-24 | Payer: MEDICARE

## 2019-05-24 NOTE — Telephone Encounter
Patient denies problems that he is aware of.  He did not know he was shocked.  He has recently had surgery on abd.  Follow up with surgeon wed.  Follow up with pcp on Thursday.  Scheduled for follow up with [facility] after pcp appt.  States he probably should be checked.

## 2019-05-24 NOTE — Telephone Encounter
-----   Message from Anner Crete, RN sent at 05/22/2019 11:23 AM CST -----  Regarding: ShockeD for VF on 1.9.21. Fluid on remote. SDO pt.  Pt OK. See remote note.   Please follow with SDO and pt.

## 2019-05-27 ENCOUNTER — Encounter: Admit: 2019-05-27 | Discharge: 2019-05-27 | Payer: MEDICARE

## 2019-05-27 DIAGNOSIS — I10 Essential (primary) hypertension: Secondary | ICD-10-CM

## 2019-05-27 DIAGNOSIS — I509 Heart failure, unspecified: Secondary | ICD-10-CM

## 2019-05-27 DIAGNOSIS — F329 Major depressive disorder, single episode, unspecified: Secondary | ICD-10-CM

## 2019-05-27 DIAGNOSIS — G988 Other disorders of nervous system: Secondary | ICD-10-CM

## 2019-05-27 DIAGNOSIS — N183 CKD (chronic kidney disease) stage 3, GFR 30-59 ml/min: Secondary | ICD-10-CM

## 2019-05-27 DIAGNOSIS — I2510 Atherosclerotic heart disease of native coronary artery without angina pectoris: Secondary | ICD-10-CM

## 2019-05-27 DIAGNOSIS — G4730 Sleep apnea, unspecified: Secondary | ICD-10-CM

## 2019-05-27 DIAGNOSIS — Z95810 Presence of automatic (implantable) cardiac defibrillator: Secondary | ICD-10-CM

## 2019-05-27 DIAGNOSIS — Z950 Presence of cardiac pacemaker: Secondary | ICD-10-CM

## 2019-05-27 DIAGNOSIS — E785 Hyperlipidemia, unspecified: Secondary | ICD-10-CM

## 2019-05-27 DIAGNOSIS — E119 Type 2 diabetes mellitus without complications: Secondary | ICD-10-CM

## 2019-05-27 DIAGNOSIS — R569 Unspecified convulsions: Secondary | ICD-10-CM

## 2019-05-27 DIAGNOSIS — G40909 Epilepsy, unspecified, not intractable, without status epilepticus: Secondary | ICD-10-CM

## 2019-05-27 DIAGNOSIS — E349 Endocrine disorder, unspecified: Secondary | ICD-10-CM

## 2019-05-27 NOTE — Assessment & Plan Note
His blood pressure looks fine on the current medical program.

## 2019-05-27 NOTE — Assessment & Plan Note
He did have some mild CAD by arteriography in 2017.  There just isn't anything to suggest an ACS related to the ICD shock Saturday morning.

## 2019-05-27 NOTE — Progress Notes
Date of Service: 05/27/2019    Manuel Houston is a 64 y.o. male.       HPI     Manuel Houston was in the [place] clinic today for an urgent work in visit.  He apparently had an ICD shock for ventricular fibrillation last Saturday morning.  The circumstances Manuel Houston because he was awake at the time and denies any sort of palpitation or lightheadedness.  He also denies having had a shock.    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  He still has a tight abdominal binder in place but says that he feels that he is making progress.    He denies any sort of cardiac symptoms or problems.  He has had no chest discomfort he denies any breathlessness he has had no fluid retention.  He has had no TIA or stroke symptoms.         Vitals:    05/27/19 1327   BP: 100/66   BP Source: Arm, Left Upper   Patient Position: Sitting   Pulse: 101   Temp: 36.6 ?C (97.8 ?F)   TempSrc: Oral   SpO2: 98%   Weight: 66.2 kg (146 lb)   Height: 1.676 m (5' 6)   PainSc: Zero     Body mass index is 23.57 kg/m?Marland Kitchen     Past Medical History  Patient Active Problem List    Diagnosis Date Noted   ? Right arm pain 09/20/2015   ? Weight loss 06/15/2015   ? CKD (chronic kidney disease) stage 3, GFR 30-59 ml/min 11/07/2010   ? Carotid atherosclerosis 05/01/2010     03/22/10 - Carotid Duplex:  Minimal plaque in the internal carotid arteries bilaterally.  (RICA 1.61 m/sec, LICA 0.83 m/sec)     ? 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        1998 - Possible coronary arteriogram, [facility] (no records, questionable historian)        10/05 - Echo doppler: EF 25%, LVH, inferior akinesis, mild MR, bi-atrial enlargement.         2/06 - Permanent disability status pending.        4/06 - EF 10-15% via left ventriculogram.  01/2015 - RVG EF 36%     ? Coronary atherosclerosis 03/27/2009       3/06 - Stress thallium. 4/06 - Cardiac cath:  dilated cardiomyopathy, minimal mid-distal LAD plaquing. EF 10-15%.     ? Biventricular implantable cardioverter-defibrillator in situ 03/27/2009       2002 - Permanent pacemaker: Manuel Houston, [place]).        2004 - Upgrade to biventricular device: Medtronic InSync III Manuel Houston, [facility]).  2012 - Upgrade to bi-V ICD     ? Essential hypertension 03/27/2009       2002 - Diagnosis established.     ? Hyperlipidemia 03/27/2009   ? Sleep apnea 03/27/2009      2002 - CPAP machine prescribed     ? DM type 2 (diabetes mellitus, type 2) (HCC) 03/27/2009      2002 - Diagnosis established.        Followed by Dr. Threasa Beards in [place]     ? Glaucoma 03/27/2009     1996 - Left eye removed surgically.     ? Seizure disorder (HCC) 03/27/2009     Idiopathic Seizure Disorder  2002 - Onset.       12/05 - Last seizure (can't operate automobile).       Followed by Dr. Sharon Seller (neurologist in [place]).     ? 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   HENT: Negative.    Eyes: Negative.    Cardiovascular: Negative.    Respiratory: Positive for cough.    Endocrine: Negative.    Hematologic/Lymphatic: Negative.    Skin: Negative.    Musculoskeletal: Negative.    Gastrointestinal: Negative.    Genitourinary: Negative.    Neurological: Negative.    Psychiatric/Behavioral: Negative.    Allergic/Immunologic: Negative.        Physical Exam    Physical Exam   General Appearance: no distress   Skin: warm, no ulcers or xanthomas   Digits and Nails: no cyanosis or clubbing   Eyes: conjunctivae and lids normal, pupils are equal and round   Teeth/Gums/Palate: dentition unremarkable, no lesions   Lips & Oral Mucosa: no pallor or cyanosis   Neck Veins: normal JVP , neck veins are not distended   Thyroid: no nodules, masses, tenderness or enlargement Chest Inspection: chest is normal in appearance   Respiratory Effort: breathing comfortably, no respiratory distress   Auscultation/Percussion: lungs clear to auscultation, no rales or rhonchi, no wheezing   PMI: PMI not enlarged or displaced   Cardiac Rhythm: regular rhythm and normal rate   Cardiac Auscultation: S1, S2 normal, no rub, no gallop   Murmurs: no murmur   Peripheral Circulation: normal peripheral circulation   Carotid Arteries: normal carotid upstroke bilaterally, no bruits   Radial Arteries: normal symmetric radial pulses   Abdominal Aorta: no abdominal aortic bruit   Pedal Pulses: normal symmetric pedal pulses   Lower Extremity Edema: no lower extremity edema   Abdominal Exam: soft, non-tender, no masses, bowel sounds normal   Liver & Spleen: no organomegaly   Gait & Station: walks without assistance   Muscle Strength: normal muscle tone   Orientation: oriented to time, place and person   Affect & Mood: appropriate and sustained affect   Language and Memory: patient responsive and seems to comprehend information   Neurologic Exam: neurological assessment grossly intact   Other: moves all extremities        Problems Addressed Today  Encounter Diagnoses   Name Primary?   ? Biventricular implantable cardioverter-defibrillator in situ    ? Essential hypertension    ? Atherosclerosis of coronary artery of native heart without angina pectoris, unspecified vessel or lesion type        Assessment and Plan       Biventricular implantable cardioverter-defibrillator in situ  ICD shock while he was awake--absolutely no symptoms, no recollection of shock.  I'm checking electrolytes today--with his recent laparotomy he may have some low potassium or magnesium levels to contribute to VF propensity.    Essential hypertension  His blood pressure looks fine on the current medical program.    Coronary atherosclerosis He did have some mild CAD by arteriography in 2017.  There just isn't anything to suggest an ACS related to the ICD shock Saturday morning.      Current Medications (including today's revisions)  ? allopurinol (ZYLOPRIM) 300 mg tablet Take 300 mg by mouth daily.     ? aspirin EC 81 mg tablet Take  one tablet by mouth daily. Take with food.   ? calcium carbonate/vitamin D3 (CALCIUM 600 + D PO) Take  by mouth twice daily.   ? candesartan (ATACAND) 4 mg tablet Take one-half tablet by mouth daily.   ? carvediloL (COREG) 6.25 mg tablet TAKE 1 TABLET 2 TIMES DAILYWITH FOOD.   ? cetirizine (ALLERGY RELIEF (CETIRIZINE)) 10 mg tablet Take 10 mg by mouth every morning.   ? cholecalciferol(+) (VITAMIN D-3) 2,000 unit tablet Take  by mouth at bedtime daily.   ? diclofenac (VOLTAREN) 1 % topical gel Apply 4 g topically to affected area three times daily.   ? ezetimibe (ZETIA) 10 mg tablet TAKE 1 TABLET ONCE DAILY   WITH SIMVASTATIN   ? furosemide (LASIX) 40 mg PO Tab take 40 mg by mouth Daily.    ? hydroxychloroquine (PLAQUENIL) 200 mg tablet Take 400 mg by mouth daily. Take with food.   ? levothyroxine (SYNTHROID) 50 mcg tablet Take 50 mcg by mouth daily.   ? liraglutide(+) (VICTOZA) 0.6 mg/0.1 mL (18 mg/3 mL) pnij Inject 1.2 mg under the skin daily.   ? magnesium oxide (MAG-OX) 400 mg (241.3 mg magnesium) tablet Take one tablet by mouth twice daily.   ? melatonin 10 mg tab Take 10 mg by mouth at bedtime daily.   ? metFORMIN (GLUCOPHAGE) 1,000 mg tablet Take 1 tablet by mouth daily.   ? Omega-3 Fatty Acids-Vitamin E (FISH OIL) 1,000 mg PO Cap Take 1,000 mg by mouth daily.   ? omeprazole DR (PRILOSEC) 20 mg capsule Take 2 tabs every morning before breakfast for 2 weeks, then one tab daily thereafter.   ? phenytoin SR (DILANTIN EXTENDED) 100 mg capsule Take 100mg  by mouth twice daily alternating with 100mg  by mouth in the morning and 200mg  in the evening every other day. ? potassium chloride (K-TAB) 20 mEq tablet Take 20 mEq by mouth twice daily.   ? sertraline (ZOLOFT) 100 mg PO Tab Take 200 mg by mouth at bedtime daily.   ? simvastatin (ZOCOR) 80 mg tablet TAKE 1 TABLET AT BEDTIME   DAILY   ? topiramate (TOPAMAX) 50 mg PO tablet take 150 mg by mouth Twice Daily.    ? zolpidem (AMBIEN) 10 mg tablet Take 10 mg by mouth at bedtime as needed for Sleep.     Total time spent on today's office visit was 30 minutes.  This includes face-to-face in person visit with patient as well as nonface-to-face time including review of the EMR, outside records, labs, radiologic studies, echocardiogram & other cardiovascular studies, formation of treatment plan, after visit summary, future disposition, and lastly on documentation.

## 2019-05-28 ENCOUNTER — Encounter: Admit: 2019-05-28 | Discharge: 2019-05-28 | Payer: MEDICARE

## 2019-05-28 DIAGNOSIS — I10 Essential (primary) hypertension: Secondary | ICD-10-CM

## 2019-05-28 DIAGNOSIS — I426 Alcoholic cardiomyopathy: Secondary | ICD-10-CM

## 2019-05-28 DIAGNOSIS — I2510 Atherosclerotic heart disease of native coronary artery without angina pectoris: Secondary | ICD-10-CM

## 2019-05-28 DIAGNOSIS — Z95810 Presence of automatic (implantable) cardiac defibrillator: Secondary | ICD-10-CM

## 2019-05-28 DIAGNOSIS — I6529 Occlusion and stenosis of unspecified carotid artery: Secondary | ICD-10-CM

## 2019-05-28 LAB — BASIC METABOLIC PANEL
Lab: 1.6 — ABNORMAL HIGH
Lab: 100
Lab: 139
Lab: 17
Lab: 220 — ABNORMAL HIGH
Lab: 24
Lab: 3.8
Lab: 8.7 — ABNORMAL LOW

## 2019-05-28 LAB — MAGNESIUM: Lab: 1.3 — ABNORMAL LOW

## 2019-05-28 MED ORDER — MAGNESIUM OXIDE 400 MG (241.3 MG MAGNESIUM) PO TAB
800 mg | ORAL_TABLET | Freq: Two times a day (BID) | ORAL | 3 refills | Status: AC
Start: 2019-05-28 — End: ?

## 2019-05-28 NOTE — Telephone Encounter
Patient notified of lab results and recommendations from [facility]. Patient is to increase K+ from 20 meq BID to TID and Mag Ox 400 BID to 800 BID and have labs redrawn in 2 weeks.

## 2019-06-09 ENCOUNTER — Encounter: Admit: 2019-06-09 | Discharge: 2019-06-09 | Payer: MEDICARE

## 2019-06-09 MED ORDER — CANDESARTAN 4 MG PO TAB
2 mg | ORAL_TABLET | Freq: Every day | ORAL | 3 refills | Status: DC
Start: 2019-06-09 — End: 2019-06-24

## 2019-06-09 NOTE — Telephone Encounter
Patient called for a refill of Atacand because his pharmacy told him it has been discontinued. A new prescription sent.

## 2019-06-10 ENCOUNTER — Encounter: Admit: 2019-06-10 | Discharge: 2019-06-10 | Payer: MEDICARE

## 2019-06-10 NOTE — Telephone Encounter
Pt has been hearing alarm daily since shocked. Informed this is normal.   He will go to [facility] and have nurses send CLE to reset alarm. L.Luikert informed and agrees to 11 am appt.

## 2019-06-14 ENCOUNTER — Encounter: Admit: 2019-06-14 | Discharge: 2019-06-14 | Payer: MEDICARE

## 2019-06-14 DIAGNOSIS — I6529 Occlusion and stenosis of unspecified carotid artery: Secondary | ICD-10-CM

## 2019-06-14 DIAGNOSIS — I426 Alcoholic cardiomyopathy: Secondary | ICD-10-CM

## 2019-06-14 DIAGNOSIS — I2510 Atherosclerotic heart disease of native coronary artery without angina pectoris: Secondary | ICD-10-CM

## 2019-06-14 DIAGNOSIS — I10 Essential (primary) hypertension: Secondary | ICD-10-CM

## 2019-06-14 LAB — BASIC METABOLIC PANEL
Lab: 1.6 — ABNORMAL HIGH (ref 0.72–1.25)
Lab: 136
Lab: 16 — ABNORMAL HIGH (ref 0–14)
Lab: 17
Lab: 187 — ABNORMAL HIGH (ref 70–105)
Lab: 25
Lab: 4.2
Lab: 8.9
Lab: 99

## 2019-06-14 LAB — MAGNESIUM: Lab: 1.6

## 2019-06-14 NOTE — Telephone Encounter
Called pt to discuss.  He denies feeling short of breath.  He states that his weight is around 140, which is stable for him.  He has had a recent abdominal surgery and states that he has lost weight with the surgery.  He is taking his lasix as prescribed, but states that he doesn't feel like he is peeing as much as he normally does.  He is concerned it is not working for him.  He denies any swelling in his extremities.   He does state that his belly feels more full than normal, but is not sure if he is holding fluid or if it is due to his recent surgery.  Labs drawn today, creatinine at 1.63.    Pt's next OV with SDO is not scheduled until 5/25, but is scheduled for a remote transmission from his device on 2/3.      Will route to Dr. Barry Dienes for any additional recommendations.

## 2019-06-14 NOTE — Progress Notes
[facility] sent and remote nursing notified.

## 2019-06-14 NOTE — Telephone Encounter
-----   Message from Vanice Sarah, MD sent at 06/14/2019 12:37 PM CST -----  IF he can do it, I think he should push the Mag up to another 400 mg/day and increase his potassium to BID.  I'd still like to get the levels up a little higher if possible.  ----- Message -----  From: Rogelia Boga, RN  Sent: 06/14/2019  10:56 AM CST  To: Vanice Sarah, MD    Labs for your review after increasing potassium and magnesium.  Creatinine stable, but still elevated.  Please let me know if you have any additional recommendations.  Thank you!

## 2019-06-14 NOTE — Telephone Encounter
Called and discussed lab results and recommendations.  Pt is agreeable to plan.  Will increase dosage.  Updated medication list.  Pt will check labs next week.

## 2019-06-14 NOTE — Telephone Encounter
-----   Message from Anner Crete, RN sent at 06/14/2019  1:28 PM CST -----  Regarding: Fluid on remote  Remote today to clear pt alarm. indicates fluid. getting worse.   Has OV with SDO end of month.

## 2019-06-16 ENCOUNTER — Ambulatory Visit: Admit: 2019-06-16 | Discharge: 2019-06-16 | Payer: MEDICARE

## 2019-06-16 DIAGNOSIS — I426 Alcoholic cardiomyopathy: Secondary | ICD-10-CM

## 2019-06-16 DIAGNOSIS — Z95810 Presence of automatic (implantable) cardiac defibrillator: Secondary | ICD-10-CM

## 2019-06-17 ENCOUNTER — Encounter: Admit: 2019-06-17 | Discharge: 2019-06-17 | Payer: MEDICARE

## 2019-06-17 NOTE — Telephone Encounter
Patient called about device check the other day.  Informed him that it indicated possible fluid build up.  Patient denied symptoms.  Encouraged to eat fresh, friuts meats and vegables avoid salt and processed foods

## 2019-06-21 ENCOUNTER — Encounter: Admit: 2019-06-21 | Discharge: 2019-06-21 | Payer: MEDICARE

## 2019-06-21 NOTE — Telephone Encounter
Received message on nursing triage line from patient stating that he is feeling really short of breath.  He is scheduled to see Dr. Barry Dienes on Thursday, but states in his message that he is hoping to be seen sooner.  I called pt and left message requesting callback to discuss symptoms, but recommended that if his symptoms are worsening that he should go to the ED for evaluation and treatment.  Left callback number for any questions or concerns.

## 2019-06-24 ENCOUNTER — Encounter: Admit: 2019-06-24 | Discharge: 2019-06-24 | Payer: MEDICARE

## 2019-06-24 DIAGNOSIS — G40909 Epilepsy, unspecified, not intractable, without status epilepticus: Secondary | ICD-10-CM

## 2019-06-24 DIAGNOSIS — I2510 Atherosclerotic heart disease of native coronary artery without angina pectoris: Secondary | ICD-10-CM

## 2019-06-24 DIAGNOSIS — I10 Essential (primary) hypertension: Secondary | ICD-10-CM

## 2019-06-24 DIAGNOSIS — N1832 Stage 3b chronic kidney disease (HCC): Secondary | ICD-10-CM

## 2019-06-24 DIAGNOSIS — F329 Major depressive disorder, single episode, unspecified: Secondary | ICD-10-CM

## 2019-06-24 DIAGNOSIS — I509 Heart failure, unspecified: Secondary | ICD-10-CM

## 2019-06-24 DIAGNOSIS — G4730 Sleep apnea, unspecified: Secondary | ICD-10-CM

## 2019-06-24 DIAGNOSIS — Z950 Presence of cardiac pacemaker: Secondary | ICD-10-CM

## 2019-06-24 DIAGNOSIS — E119 Type 2 diabetes mellitus without complications: Secondary | ICD-10-CM

## 2019-06-24 DIAGNOSIS — E349 Endocrine disorder, unspecified: Secondary | ICD-10-CM

## 2019-06-24 DIAGNOSIS — E785 Hyperlipidemia, unspecified: Secondary | ICD-10-CM

## 2019-06-24 DIAGNOSIS — R569 Unspecified convulsions: Secondary | ICD-10-CM

## 2019-06-24 DIAGNOSIS — N183 CKD (chronic kidney disease) stage 3, GFR 30-59 ml/min (HCC): Secondary | ICD-10-CM

## 2019-06-24 DIAGNOSIS — G988 Other disorders of nervous system: Secondary | ICD-10-CM

## 2019-06-24 DIAGNOSIS — I426 Alcoholic cardiomyopathy: Secondary | ICD-10-CM

## 2019-06-24 DIAGNOSIS — Z95810 Presence of automatic (implantable) cardiac defibrillator: Secondary | ICD-10-CM

## 2019-06-24 MED ORDER — OMEPRAZOLE 20 MG PO CPDR
40 mg | ORAL_CAPSULE | Freq: Every day | ORAL | 1 refills | Status: AC
Start: 2019-06-24 — End: ?

## 2019-06-24 NOTE — Patient Instructions
1.  Stop candesartan  2.  Increase omeprazole to 40 mg/day (2 tablets every morning)

## 2019-06-24 NOTE — Assessment & Plan Note
He had one isolated shock back in December and his potassium and magnesium levels were low at the time.

## 2019-06-24 NOTE — Assessment & Plan Note
His renal function is stable.

## 2019-06-24 NOTE — Progress Notes
Date of Service: 06/24/2019    Manuel Houston is a 64 y.o. male.       HPI     Manuel Houston was in the [facility] clinic today for a work in visit regarding possible fluid retention.  His device has indicated possible fluid retention and he was instructed by one of the cardiology nurses to present to the [facility] ED Monday evening.  He was there for several hours and his work-up was really pretty unremarkable.  He had a chest x-ray that did not show evidence of increased lung water.  His lab work if anything looks a little better than it had a couple of weeks previously.  His creatinine is stable and his serum potassium and magnesium levels are now within a normal range.  He tested negative for Covid and influenza.  His troponin level was normal.    He saw his surgeon at [facility] yesterday.  There was some thought that perhaps his stomach upset was related to abdominal fluid retention.  His abdomen is not at all distended however and he has not had problems with peripheral edema.  He is more short of breath than usual but his blood pressure is quite a bit lower than usual as well.    He is trying to be real cautious about his fluid and salt intake because of the concern about heart failure.  He really has not been eating normally and says that he feels like his stomach is upset all the time.    His blood pressure is checked 3 times weekly by his home health nurse and it has been lower than usual.  He has not really had any lightheadedness.  He has not had any palpitation or syncope.  He has had no ICD shocks lately.         Vitals:    06/24/19 0946   BP: (!) 88/64   BP Source: Arm, Left Upper   Patient Position: Sitting   Pulse: 99   SpO2: 98%   Weight: 67.1 kg (148 lb)   Height: 1.676 m (5' 6)   PainSc: Zero     Body mass index is 23.89 kg/m?Marland Kitchen     Past Medical History  Patient Active Problem List    Diagnosis Date Noted   ? Right arm pain 09/20/2015   ? Weight loss 06/15/2015   ? CKD (chronic kidney disease) stage 3, GFR 30-59 ml/min (HCC) 11/07/2010   ? Carotid atherosclerosis 05/01/2010     03/22/10 - Carotid Duplex:  Minimal plaque in the internal carotid arteries bilaterally.  (RICA 1.61 m/sec, LICA 0.83 m/sec)     ? 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        1998 - Possible coronary arteriogram, [facility] (no records, questionable historian)        10/05 - Echo doppler: EF 25%, LVH, inferior akinesis, mild MR, bi-atrial enlargement.         2/06 - Permanent disability status pending.        4/06 - EF 10-15% via left ventriculogram.  01/2015 - RVG EF 36%     ? Coronary atherosclerosis 03/27/2009       3/06 - Stress thallium.        4/06 - Cardiac cath:  dilated cardiomyopathy, minimal mid-distal LAD plaquing. EF 10-15%.     ? Biventricular implantable cardioverter-defibrillator in situ 03/27/2009       2002 - Permanent  pacemaker: Phylliss Bob, [place]).        2004 - Upgrade to biventricular device: Medtronic InSync III Phylliss Bob, [facility]).  2012 - Upgrade to bi-V ICD     ? Essential hypertension 03/27/2009       2002 - Diagnosis established.     ? Hyperlipidemia 03/27/2009   ? Sleep apnea 03/27/2009      2002 - CPAP machine prescribed     ? DM type 2 (diabetes mellitus, type 2) (HCC) 03/27/2009      2002 - Diagnosis established.        Followed by Dr. Threasa Beards in [place]     ? Glaucoma 03/27/2009     1996 - Left eye removed surgically.     ? Seizure disorder (HCC) 03/27/2009     Idiopathic Seizure Disorder       2002 - Onset.       12/05 - Last seizure (can't operate automobile).       Followed by Dr. Sharon Seller (neurologist in [place]).     ? 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   HENT: Negative.    Eyes: Negative.    Cardiovascular: Positive for dyspnea on exertion.   Respiratory: Positive for shortness of breath. Endocrine: Negative.    Hematologic/Lymphatic: Negative.    Skin: Negative.    Musculoskeletal: Negative.    Gastrointestinal: Negative.    Genitourinary: Negative.    Neurological: Negative.    Psychiatric/Behavioral: Negative.    Allergic/Immunologic: Negative.        Physical Exam    Physical Exam   General Appearance: no distress, looks ill, thin  Skin: warm, no ulcers or xanthomas   Digits and Nails: no cyanosis or clubbing   Eyes: conjunctivae and lids normal, pupils are equal and round   Teeth/Gums/Palate: dentition unremarkable, no lesions   Lips & Oral Mucosa: no pallor or cyanosis   Neck Veins: normal JVP , neck veins are not distended   Thyroid: no nodules, masses, tenderness or enlargement   Chest Inspection: chest is normal in appearance   Respiratory Effort: breathing comfortably, no respiratory distress   Auscultation/Percussion: lungs clear to auscultation, no rales or rhonchi, no wheezing   PMI: PMI not enlarged or displaced   Cardiac Rhythm: regular rhythm and normal rate   Cardiac Auscultation: S1, S2 normal, no rub, no gallop   Murmurs: no murmur   Peripheral Circulation: normal peripheral circulation   Carotid Arteries: normal carotid upstroke bilaterally, no bruits   Radial Arteries: normal symmetric radial pulses   Abdominal Aorta: no abdominal aortic bruit   Pedal Pulses: normal symmetric pedal pulses   Lower Extremity Edema: no lower extremity edema   Abdominal Exam: soft, non-tender, bandages in place over surgical incision   Gait & Station: walks without assistance   Muscle Strength: normal muscle tone   Orientation: oriented to time, place and person   Affect & Mood: appropriate and sustained affect   Language and Memory: patient responsive and seems to comprehend information   Neurologic Exam: neurological assessment grossly intact   Other: moves all extremities      Problems Addressed Today  Encounter Diagnoses   Name Primary?   ? Essential hypertension    ? Atherosclerosis of coronary artery of native heart without angina pectoris, unspecified vessel or lesion type    ?  Biventricular implantable cardioverter-defibrillator in situ    ? Stage 3b chronic kidney disease (HCC)    ? Alcoholic cardiomyopathy (HCC)        Assessment and Plan       Essential hypertension  I am going to have him stop the candesartan because I do not think he needs the blood pressure lowering effect with his weight loss and decreased oral intake.  I have encouraged him to go ahead and drink a little more fluid and to not worry quite so much about his salt intake.  He has meals that are delivered and I have encouraged him to go ahead and try to eat a little more normally to get his weight up more quickly.    Coronary atherosclerosis  I do not see any evidence of any sort of cardiac ischemia contributing to his current situation.    Biventricular implantable cardioverter-defibrillator in situ  He had one isolated shock back in December and his potassium and magnesium levels were low at the time.    CKD (chronic kidney disease) stage 3, GFR 30-59 ml/min (HCC)  His renal function is stable.    Alcoholic cardiomyopathy (HCC)  Honestly, I do not see clinical manifestations of fluid retention on his exam today.  A lot of times when the devices indicate fluid retention it does not necessarily correlate with the clinical findings and I think this may be the case for Jeron.  I do not think we need to give him more diuretic at this point and in fact I think that might actually make things a little worse for him.      Current Medications (including today's revisions)  ? allopurinol (ZYLOPRIM) 300 mg tablet Take 300 mg by mouth daily.     ? aspirin EC 81 mg tablet Take one tablet by mouth daily. Take with food.   ? calcium carbonate/vitamin D3 (CALCIUM 600 + D PO) Take  by mouth twice daily.   ? carvediloL (COREG) 6.25 mg tablet TAKE 1 TABLET 2 TIMES DAILYWITH FOOD.   ? cetirizine (ALLERGY RELIEF (CETIRIZINE)) 10 mg tablet Take 10 mg by mouth every morning.   ? cholecalciferol(+) (VITAMIN D-3) 2,000 unit tablet Take  by mouth at bedtime daily.   ? diclofenac (VOLTAREN) 1 % topical gel Apply 4 g topically to affected area three times daily.   ? ezetimibe (ZETIA) 10 mg tablet TAKE 1 TABLET ONCE DAILY   WITH SIMVASTATIN   ? furosemide (LASIX) 40 mg PO Tab take 40 mg by mouth Daily.    ? hydroxychloroquine (PLAQUENIL) 200 mg tablet Take 400 mg by mouth daily. Take with food.   ? levothyroxine (SYNTHROID) 50 mcg tablet Take 50 mcg by mouth daily.   ? liraglutide(+) (VICTOZA) 0.6 mg/0.1 mL (18 mg/3 mL) pnij Inject 1.2 mg under the skin daily.   ? magnesium oxide (MAG-OX) 400 mg (241.3 mg magnesium) tablet Take two tablets by mouth twice daily. (Patient taking differently: Take 1,200 mg by mouth twice daily.)   ? melatonin 10 mg tab Take 10 mg by mouth at bedtime daily.   ? metFORMIN (GLUCOPHAGE) 1,000 mg tablet Take 1 tablet by mouth daily.   ? Omega-3 Fatty Acids-Vitamin E (FISH OIL) 1,000 mg PO Cap Take 1,000 mg by mouth daily.   ? omeprazole DR (PRILOSEC) 20 mg capsule Take two capsules by mouth daily before breakfast.   ? phenytoin SR (DILANTIN EXTENDED) 100 mg capsule Take 100mg  by mouth twice daily alternating with 100mg  by mouth  in the morning and 200mg  in the evening every other day.   ? potassium chloride (K-TAB) 20 mEq tablet Take 40 mEq by mouth twice daily.   ? sertraline (ZOLOFT) 100 mg PO Tab Take 200 mg by mouth at bedtime daily.   ? simvastatin (ZOCOR) 80 mg tablet TAKE 1 TABLET AT BEDTIME   DAILY   ? topiramate (TOPAMAX) 50 mg PO tablet take 150 mg by mouth Twice Daily.    ? zolpidem (AMBIEN) 10 mg tablet Take 10 mg by mouth at bedtime as needed for Sleep.     Total time spent on today's office visit was 30 minutes.  This includes face-to-face in person visit with patient as well as nonface-to-face time including review of the EMR, outside records, labs, radiologic studies, echocardiogram & other cardiovascular studies, formation of treatment plan, after visit summary, future disposition, and lastly on documentation.

## 2019-06-24 NOTE — Assessment & Plan Note
I do not see any evidence of any sort of cardiac ischemia contributing to his current situation.

## 2019-06-24 NOTE — Assessment & Plan Note
I am going to have him stop the candesartan because I do not think he needs the blood pressure lowering effect with his weight loss and decreased oral intake.  I have encouraged him to go ahead and drink a little more fluid and to not worry quite so much about his salt intake.  He has meals that are delivered and I have encouraged him to go ahead and try to eat a little more normally to get his weight up more quickly.

## 2019-06-24 NOTE — Assessment & Plan Note
Honestly, I do not see clinical manifestations of fluid retention on his exam today.  A lot of times when the devices indicate fluid retention it does not necessarily correlate with the clinical findings and I think this may be the case for Manuel Houston.  I do not think we need to give him more diuretic at this point and in fact I think that might actually make things a little worse for him.

## 2019-06-30 ENCOUNTER — Encounter: Admit: 2019-06-30 | Discharge: 2019-06-30 | Payer: MEDICARE

## 2019-07-11 IMAGING — CR LOW_EXM
3 series · 3 of 3 positions shown · non-contrast
Comparison: none

[foot]
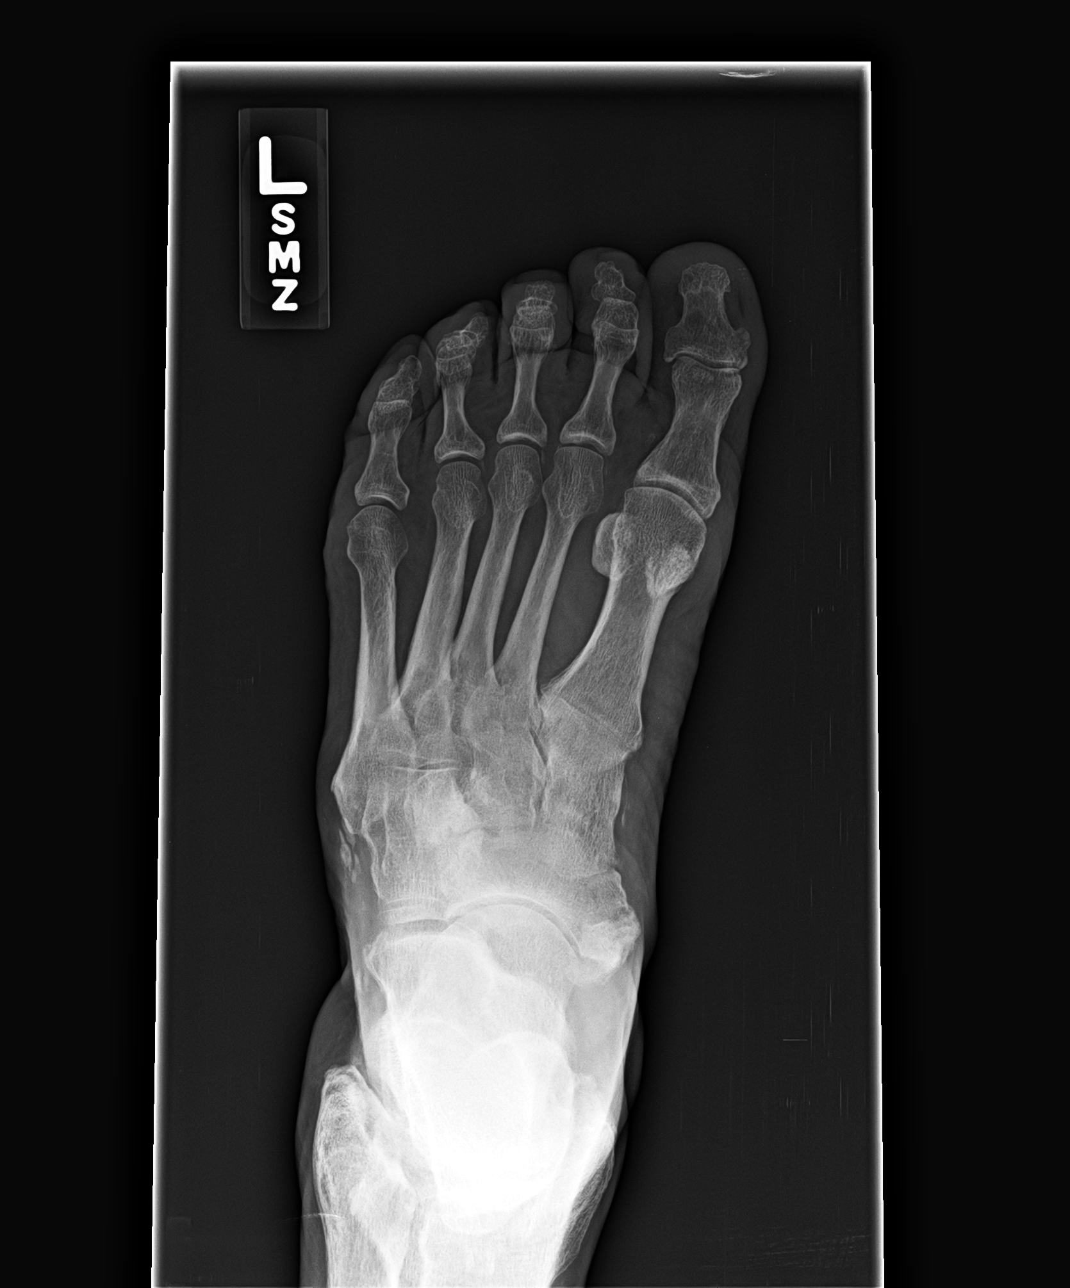

[foot obl]
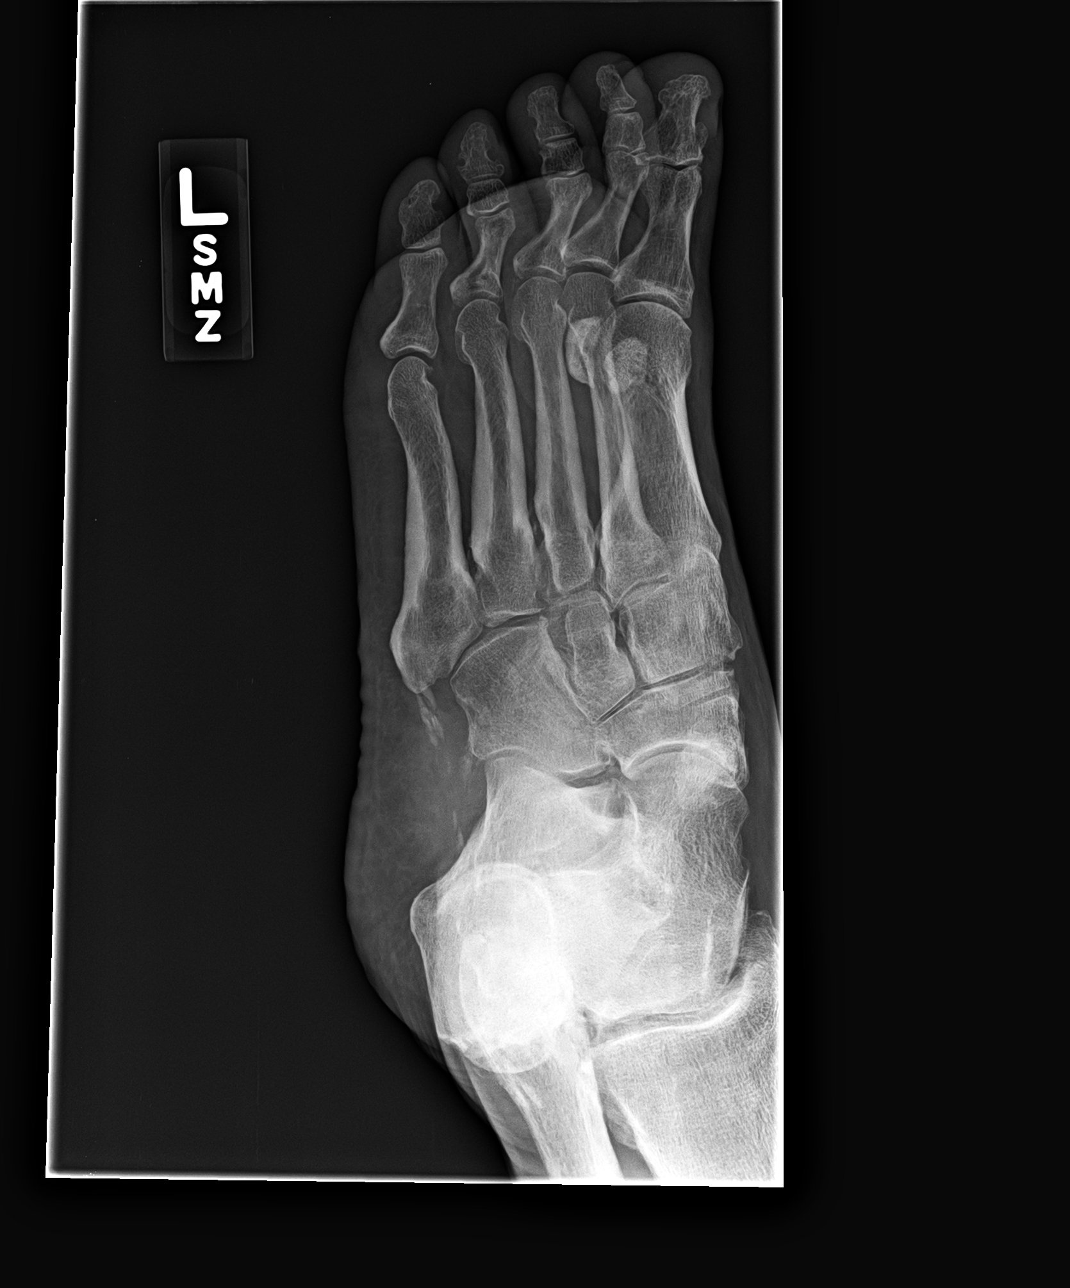

[foot lat]
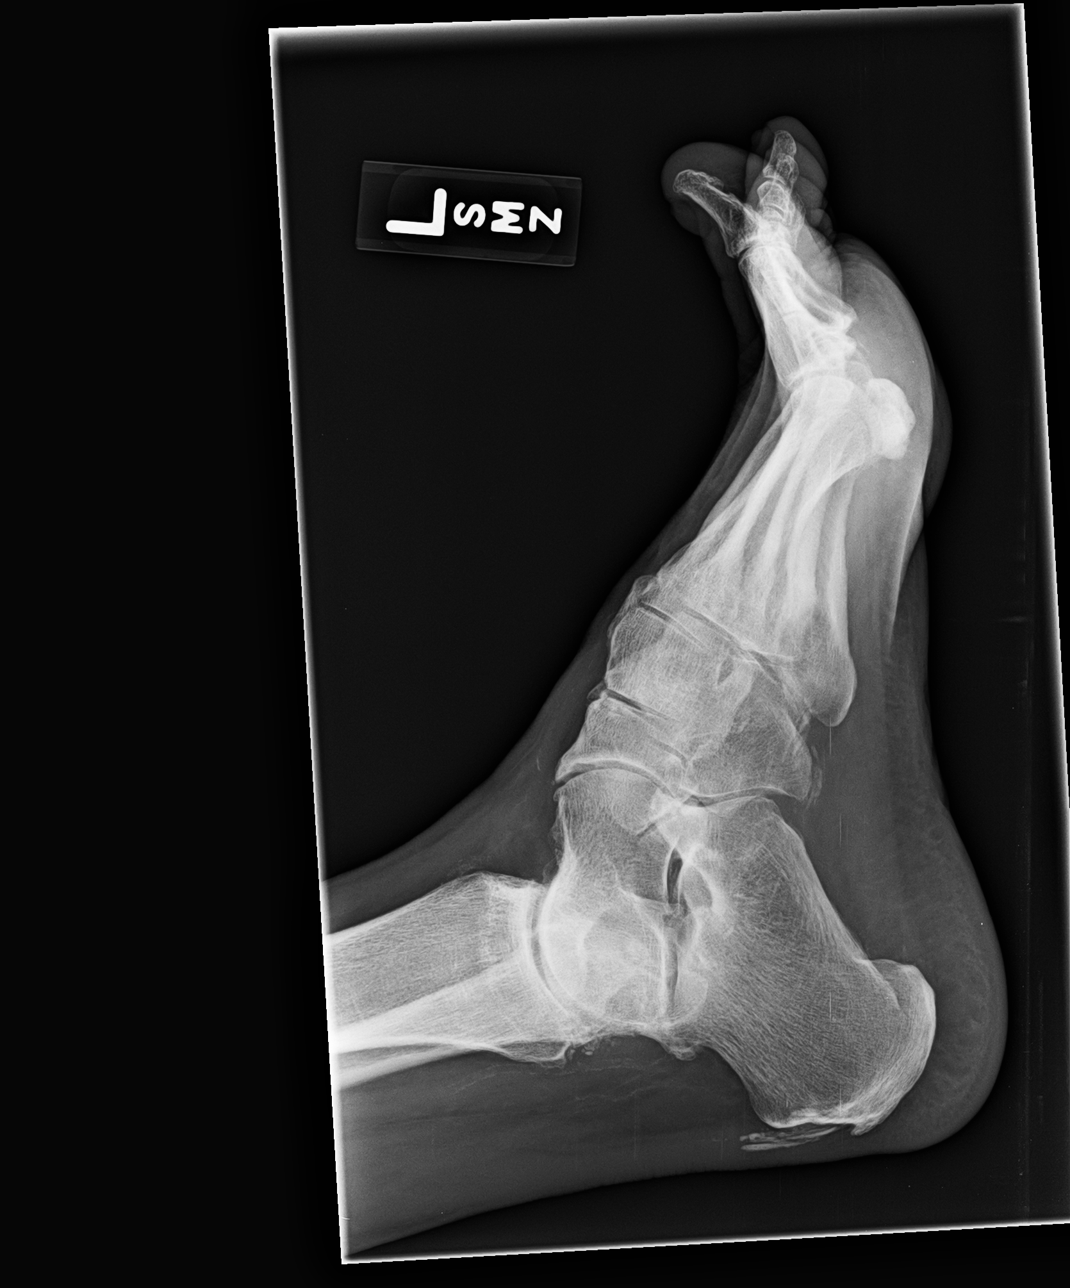

[3 of 3 positions shown; findings below may reference images not displayed]

EXAM

3 VIEW LEFT FOOT

INDICATION

Left foot pain

TECHNIQUE

Frontal, oblique, and lateral radiographs of the left foot were submitted.

COMPARISONS

Left foot radiographs September 24, 2017.

FINDINGS

No widening of the Lisfranc interval. Metatarsal heads are round. Bulky accessory navicular ossicle
re-demonstrated. Calcification along the peroneus brevis insertion noted. No acute displaced
fracture or malalignment. Interphalangeal joints are flexed mildly limiting evaluation. Posterior
calcaneal enthesophyte distal Achilles tendon insertion. Vascular calcifications. Mild scattered
osteoarthritic changes.

IMPRESSION

No acute bony abnormality.

Tech Notes:

2 months of pain in his left foot and ankle at night. h/o previous fx 2 years ago. sz/me

## 2019-07-11 IMAGING — CR LOW_EXM
3 series · 3 of 3 positions shown · non-contrast
Comparison: none

[ankle ap]
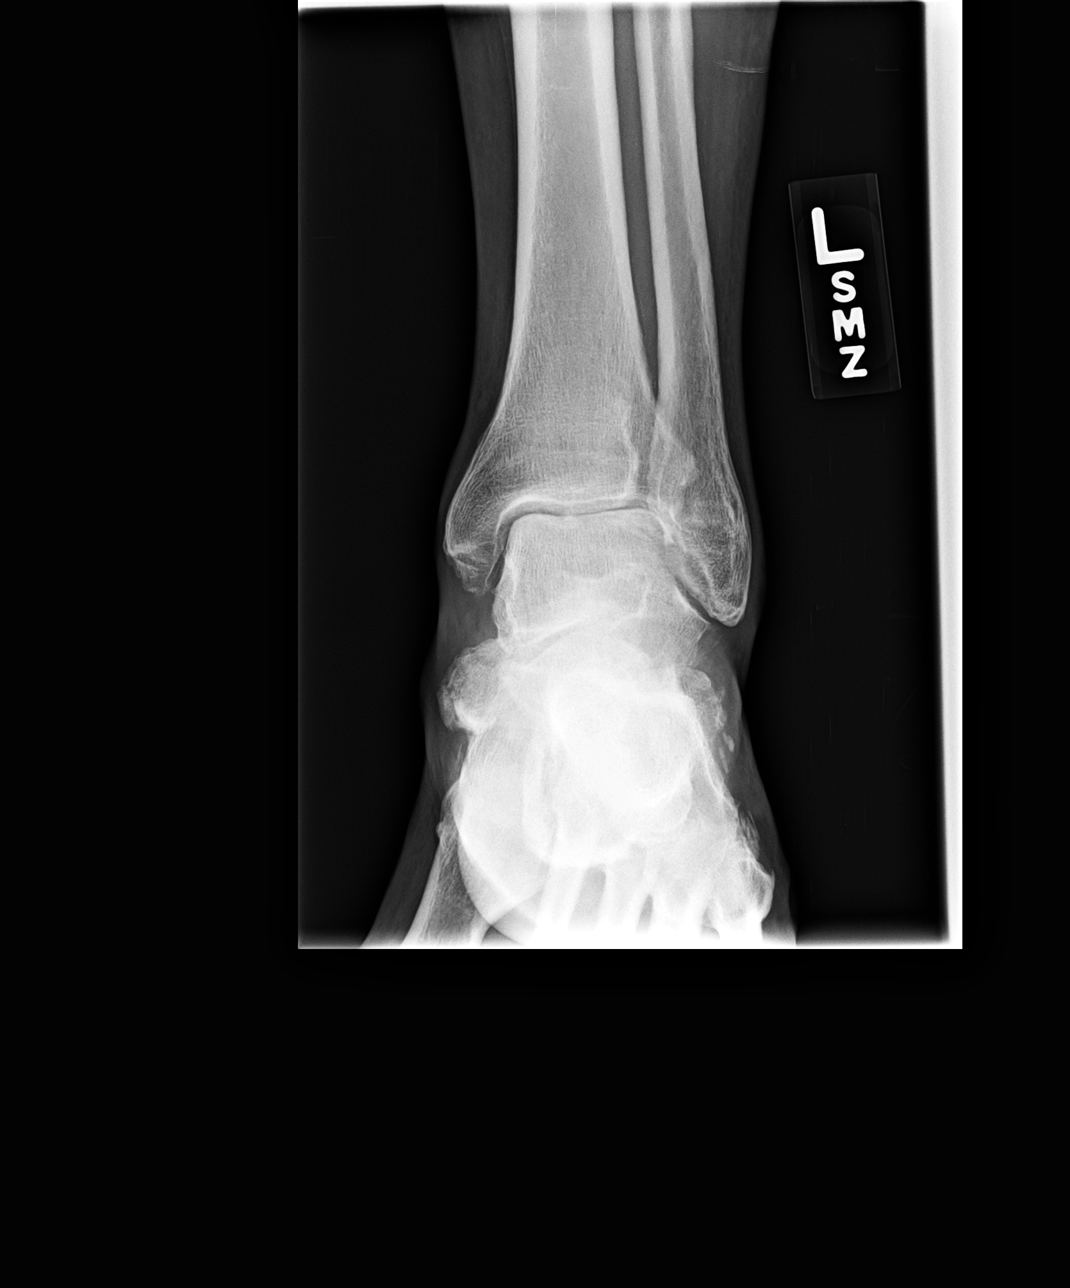

[ankle obl]
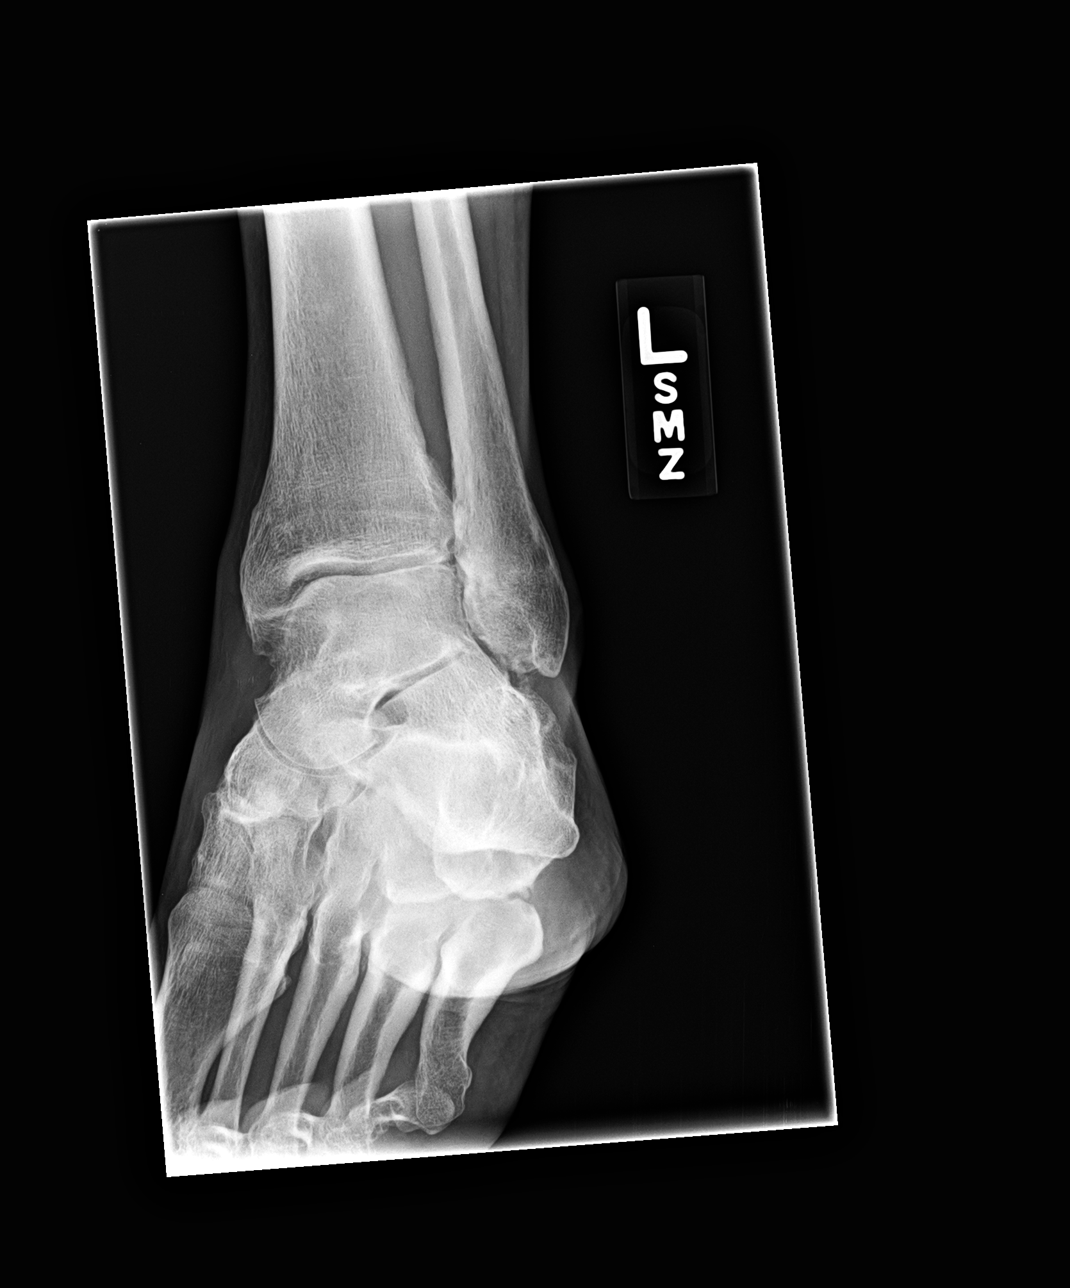

[ankle lat]
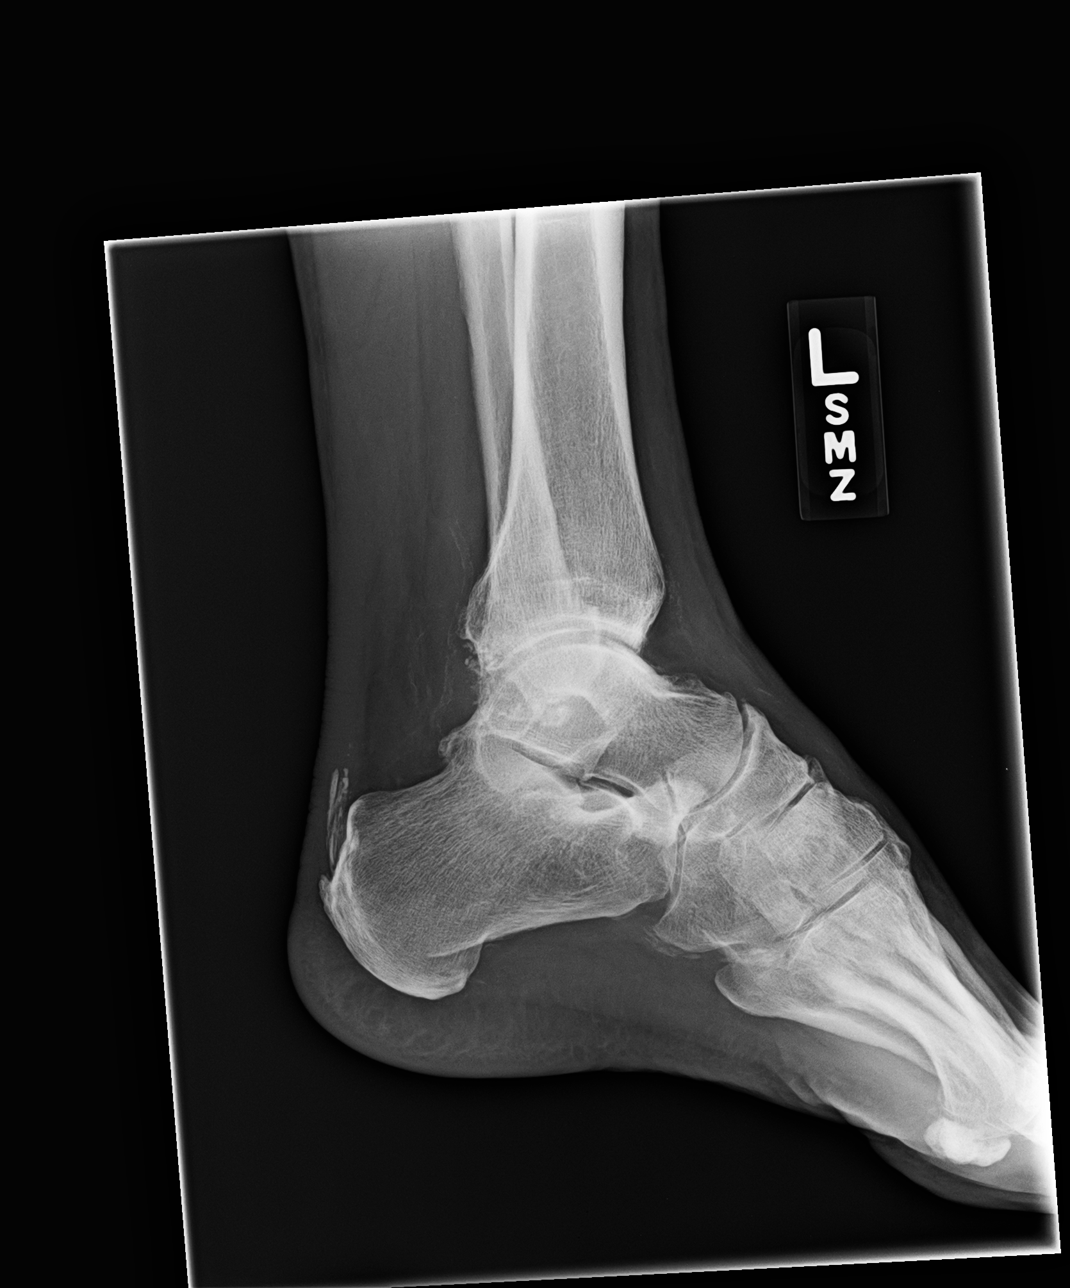

[3 of 3 positions shown; findings below may reference images not displayed]

EXAM

3 VIEW LEFT ANKLE

INDICATION

Left ankle pain

COMPARISONS

Left ankle radiographs December 30, 2016.

FINDINGS

The ankle mortise is congruent without displaced fracture or malalignment. The talar dome is smooth
without focal lucency. No destructive bone lesion is identified. Posterior calcaneal enthesophyte
distal Achilles tendon insertion. Osteoarthritic spurring is present in the midfoot and hindfoot as
well as the tibiotalar articulation. Vascular calcifications are present. Overall osteoarthritis is
mild-to-moderate.

IMPRESSION

No acute bony abnormality.

Tech Notes:

2 months of pain in his left foot and ankle at night. h/o previous fx 2 years ago. sz/me

## 2019-07-16 ENCOUNTER — Encounter: Admit: 2019-07-16 | Discharge: 2019-07-16 | Payer: MEDICARE

## 2019-07-16 IMAGING — RF FL video swallow w speech [PERSON_NAME]
1 series · 1 of 1 positions shown · non-contrast
Comparison: none

[Series 1: esophogr 1 · 1 of 1 slices shown]
[im 1/1]
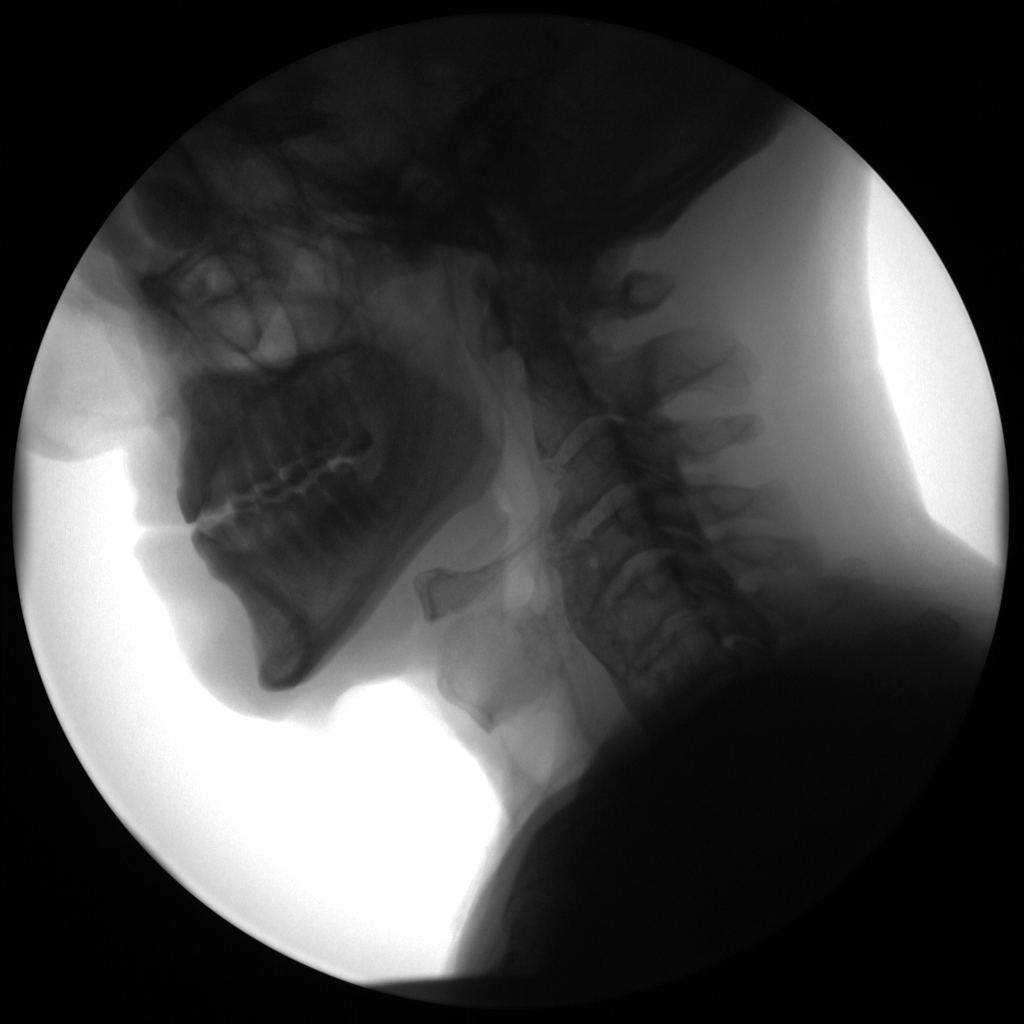

[1 of 1 positions shown; findings below may reference images not displayed]

DIAGNOSTIC STUDIES

EXAM
Video swallow

INDICATION
dysphagia
DYSPHAGIA. TIME: 102 SEC. DOSE: 5.1 mGy. ME

TECHNIQUE
Exam was performed conjunction with speech pathology. Fluoro time was [DATE] 2 seconds. Single spot
film was obtained.

COMPARISONS
None available

FINDINGS
There is generally good pharyngeal without evidence for aspiration or penetration. Mild pooling in
the valleculae is noted. Prominent osteophytes project within the upper cervical spine beginning at

IMPRESSION
No evidence for aspiration or penetration with generally good pharyngeal motility. There is mild
pooling in the valleculae.
Prominent anterior osteophytic spurring of the cervical spine which is not completely evaluated.
This may reflect changes of underlying DISH.]

Tech Notes:

DYSPHAGIA. TIME: 102 SEC. DOSE: 5.1 mGy. ME

## 2019-07-20 ENCOUNTER — Encounter: Admit: 2019-07-20 | Discharge: 2019-07-20 | Payer: MEDICARE

## 2019-07-20 NOTE — Telephone Encounter
Patient continues to have episode of sob.  Discussed with SDO order received to move up appt.

## 2019-07-21 ENCOUNTER — Encounter: Admit: 2019-07-21 | Discharge: 2019-07-21 | Payer: MEDICARE

## 2019-07-21 DIAGNOSIS — I2510 Atherosclerotic heart disease of native coronary artery without angina pectoris: Secondary | ICD-10-CM

## 2019-07-21 DIAGNOSIS — I10 Essential (primary) hypertension: Secondary | ICD-10-CM

## 2019-07-21 DIAGNOSIS — R0600 Dyspnea, unspecified: Secondary | ICD-10-CM

## 2019-07-21 DIAGNOSIS — I426 Alcoholic cardiomyopathy: Secondary | ICD-10-CM

## 2019-07-21 LAB — BNP (B-TYPE NATRIURETIC PEPTI): Lab: 222 — ABNORMAL HIGH (ref 0–100)

## 2019-07-21 LAB — BASIC METABOLIC PANEL
Lab: 136
Lab: 23
Lab: 4.9
Lab: 97 — ABNORMAL LOW (ref 98–107)

## 2019-07-21 LAB — CBC
Lab: 16 — ABNORMAL HIGH (ref 11.5–14.5)
Lab: 171
Lab: 4.8 — ABNORMAL HIGH (ref 0.72–1.25)
Lab: 6.3 — ABNORMAL HIGH (ref 8.4–25.7)
Lab: 13 — ABNORMAL LOW (ref 14.0–18.0)
Lab: 28
Lab: 30
Lab: 45
Lab: 94

## 2019-07-21 MED ORDER — METOLAZONE 2.5 MG PO TAB
2.5 mg | ORAL_TABLET | ORAL | 3 refills | 84.00000 days | Status: AC
Start: 2019-07-21 — End: ?

## 2019-07-22 ENCOUNTER — Encounter: Admit: 2019-07-22 | Discharge: 2019-07-22 | Payer: MEDICARE

## 2019-07-22 ENCOUNTER — Ambulatory Visit: Admit: 2019-07-22 | Discharge: 2019-07-22 | Payer: MEDICARE

## 2019-07-24 ENCOUNTER — Inpatient Hospital Stay: Admit: 2019-07-24 | Discharge: 2019-07-24 | Payer: MEDICARE

## 2019-07-24 ENCOUNTER — Encounter: Admit: 2019-07-24 | Discharge: 2019-07-24 | Payer: MEDICARE

## 2019-07-24 ENCOUNTER — Inpatient Hospital Stay: Admit: 2019-07-24 | Payer: MEDICARE

## 2019-07-24 DIAGNOSIS — R570 Cardiogenic shock: Secondary | ICD-10-CM

## 2019-07-24 DIAGNOSIS — J939 Pneumothorax, unspecified: Secondary | ICD-10-CM

## 2019-07-24 LAB — URINALYSIS, MICROSCOPIC

## 2019-07-24 LAB — POC BLOOD GAS ARTERIAL
Lab: 13 MMOL/L — ABNORMAL LOW (ref 21–28)
Lab: 27 mmHg — ABNORMAL LOW (ref 35–45)
Lab: 7.3 — ABNORMAL LOW (ref 7.35–7.45)
Lab: 10 MMOL/L
Lab: 44 mmHg (ref 35–45)
Lab: 7.2 — ABNORMAL LOW (ref 7.35–7.45)

## 2019-07-24 LAB — COMPREHENSIVE METABOLIC PANEL
Lab: 1 mg/dL — ABNORMAL LOW (ref 0.3–1.2)
Lab: 1.1 mg/dL (ref 0.3–1.2)
Lab: 10 mg/dL (ref 8.5–10.6)
Lab: 130 MMOL/L — ABNORMAL LOW (ref 137–147)
Lab: 131 U/L — ABNORMAL HIGH (ref 7–40)
Lab: 134 MMOL/L — ABNORMAL LOW (ref 137–147)
Lab: 15 MMOL/L — ABNORMAL LOW (ref 21–30)
Lab: 161 U/L — ABNORMAL HIGH (ref 25–110)
Lab: 177 U/L — ABNORMAL HIGH (ref 25–110)
Lab: 18 mL/min — ABNORMAL LOW (ref >60)
Lab: 199 mg/dL — ABNORMAL HIGH (ref 70–100)
Lab: 22 mL/min — ABNORMAL LOW (ref >60)
Lab: 23 — ABNORMAL HIGH (ref 3–12)
Lab: 3.3 g/dL — ABNORMAL LOW (ref 3.5–5.0)
Lab: 3.3 mg/dL — ABNORMAL HIGH (ref 0.4–1.24)
Lab: 3.4 mg/dL — ABNORMAL HIGH (ref 0.4–1.24)
Lab: 3.8 g/dL — ABNORMAL HIGH (ref 3.5–5.0)
Lab: 4.7 g/dL — ABNORMAL LOW (ref 6.0–8.0)
Lab: 5.7 g/dL — ABNORMAL LOW (ref 6.0–8.0)
Lab: 64 mg/dL — ABNORMAL HIGH (ref 7–25)
Lab: 725 U/L — ABNORMAL HIGH (ref 7–56)
Lab: 9.1 mg/dL (ref 8.5–10.6)

## 2019-07-24 LAB — POC HEMATOCRIT
Lab: 14 g/dL — ABNORMAL HIGH (ref 13.5–16.5)
Lab: 43 % (ref 40–50)
Lab: 13 g/dL — ABNORMAL HIGH (ref 13.5–16.5)
Lab: 40 % — ABNORMAL LOW (ref 40–50)

## 2019-07-24 LAB — LACTIC ACID(LACTATE)
Lab: 5.4 MMOL/L — ABNORMAL HIGH (ref 0.5–2.0)
Lab: 9.1 MMOL/L — ABNORMAL HIGH (ref 0.5–2.0)

## 2019-07-24 LAB — POC POTASSIUM
Lab: 6.4 MMOL/L — ABNORMAL HIGH (ref 3.5–5.1)
Lab: 4.9 MMOL/L (ref 3.5–5.1)

## 2019-07-24 LAB — PROTIME INR (PT): Lab: 3.2 — ABNORMAL HIGH (ref 0.8–1.2)

## 2019-07-24 LAB — URINALYSIS DIPSTICK
Lab: NEGATIVE
Lab: NEGATIVE
Lab: NEGATIVE
Lab: NEGATIVE
Lab: NEGATIVE

## 2019-07-24 LAB — THYROID STIMULATING HORMONE-TSH: Lab: 8.9 uU/mL — ABNORMAL HIGH (ref 0.35–5.00)

## 2019-07-24 LAB — TROPONIN-I: Lab: 0.1 ng/mL — ABNORMAL HIGH (ref 0.0–0.05)

## 2019-07-24 LAB — CBC AND DIFF
Lab: 17 % — ABNORMAL HIGH (ref 11–15)
Lab: 29 pg (ref 26–34)
Lab: 32 g/dL (ref 32.0–36.0)
Lab: 8.7 10*3/uL (ref 4.5–11.0)
Lab: 89 FL (ref 80–100)

## 2019-07-24 LAB — PHOSPHORUS: Lab: 7.7 mg/dL — ABNORMAL HIGH (ref 2.0–4.5)

## 2019-07-24 LAB — COVID-19 (SARS-COV-2) PCR

## 2019-07-24 LAB — LDH-LACTATE DEHYDROGENASE: Lab: 185 U/L — ABNORMAL HIGH (ref 100–210)

## 2019-07-24 LAB — MAGNESIUM
Lab: 2.5 mg/dL — ABNORMAL LOW (ref 1.6–2.6)
Lab: 2.4 mg/dL (ref 1.6–2.6)

## 2019-07-24 LAB — O2 SATURATION, MIXED VENOUS: Lab: 31 %

## 2019-07-24 LAB — POC GLUCOSE: Lab: 213 mg/dL — ABNORMAL HIGH (ref 70–100)

## 2019-07-24 LAB — POC SODIUM
Lab: 127 MMOL/L — ABNORMAL LOW (ref 137–147)
Lab: 128 MMOL/L — ABNORMAL LOW (ref 137–147)

## 2019-07-24 LAB — PHENYTOIN, TOTAL (DILANTIN): Lab: 12 ug/mL — ABNORMAL HIGH (ref 10.0–20.0)

## 2019-07-24 LAB — BNP (B-TYPE NATRIURETIC PEPTI): Lab: 343 pg/mL — ABNORMAL HIGH (ref 0–100)

## 2019-07-24 MED ORDER — SIMVASTATIN 40 MG PO TAB
80 mg | Freq: Every evening | ORAL | 0 refills | Status: DC
Start: 2019-07-24 — End: 2019-07-24

## 2019-07-24 MED ORDER — PHENYTOIN SODIUM EXTENDED 100 MG PO CAP
100 mg | Freq: Every day | ORAL | 0 refills | Status: DC
Start: 2019-07-24 — End: 2019-07-25

## 2019-07-24 MED ORDER — INSULIN ASPART 100 UNIT/ML SC FLEXPEN
0-6 [IU] | Freq: Before meals | SUBCUTANEOUS | 0 refills | Status: DC
Start: 2019-07-24 — End: 2019-07-27
  Administered 2019-07-26: 1 [IU] via SUBCUTANEOUS
  Administered 2019-07-26: 13:00:00 2 [IU] via SUBCUTANEOUS

## 2019-07-24 MED ORDER — AMIODARONE IN DEXTROSE,ISO-OSM 360 MG/200 ML (1.8 MG/ML) IV SOLN
.5 mg/min | INTRAVENOUS | 0 refills | Status: DC
Start: 2019-07-24 — End: 2019-07-27
  Administered 2019-07-24 – 2019-07-25 (×4): 1 mg/min via INTRAVENOUS
  Administered 2019-07-26 – 2019-07-27 (×4): 0.5 mg/min via INTRAVENOUS

## 2019-07-24 MED ORDER — SERTRALINE 50 MG PO TAB
200 mg | Freq: Every evening | ORAL | 0 refills | Status: DC
Start: 2019-07-24 — End: 2019-07-31
  Administered 2019-07-26 – 2019-07-31 (×6): 200 mg via ORAL

## 2019-07-24 MED ORDER — SODIUM CITRATE 4 % (3 ML) IK SYRG
1-6 mL | 0 refills | Status: DC | PRN
Start: 2019-07-24 — End: 2019-08-02
  Administered 2019-07-24 (×2): 1.6 mL
  Administered 2019-07-25 – 2019-07-26 (×2): 3 mL
  Administered 2019-07-31: 16:00:00 3.2 mL

## 2019-07-24 MED ORDER — HYDROXYCHLOROQUINE 200 MG PO TAB
400 mg | Freq: Every day | ORAL | 0 refills | Status: DC
Start: 2019-07-24 — End: 2019-07-25

## 2019-07-24 MED ORDER — CRRT HEPARIN REPLACEMENT SOLN-POST 5000ML
INTRAVENOUS_CENTRAL | 0 refills | Status: DC
Start: 2019-07-24 — End: 2019-07-25
  Administered 2019-07-25: 04:00:00 5000.000 mL via INTRAVENOUS_CENTRAL

## 2019-07-24 MED ORDER — VASOPRESSIN 20 UNITS IN 100ML IV INFUSION
.6-2.4 [IU]/h | INTRAVENOUS | 0 refills | Status: DC
Start: 2019-07-24 — End: 2019-07-31
  Administered 2019-07-24 – 2019-07-25 (×10): 3.6 [IU]/h via INTRAVENOUS
  Administered 2019-07-26: 22:00:00 2.4 [IU]/h via INTRAVENOUS
  Administered 2019-07-26 (×3): 3.6 [IU]/h via INTRAVENOUS
  Administered 2019-07-26: 22:00:00 2.4 [IU]/h via INTRAVENOUS
  Administered 2019-07-26: 08:00:00 3.6 [IU]/h via INTRAVENOUS
  Administered 2019-07-27 (×6): 2.4 [IU]/h via INTRAVENOUS
  Administered 2019-07-29 (×10): 3.6 [IU]/h via INTRAVENOUS
  Administered 2019-07-30: 16:00:00 1.8 [IU]/h via INTRAVENOUS
  Administered 2019-07-30: 10:00:00 3.6 [IU]/h via INTRAVENOUS
  Administered 2019-07-30: 16:00:00 1.8 [IU]/h via INTRAVENOUS
  Administered 2019-07-30 (×3): 3.6 [IU]/h via INTRAVENOUS

## 2019-07-24 MED ORDER — PHENYTOIN SODIUM EXTENDED 100 MG PO CAP
200 mg | ORAL | 0 refills | Status: DC
Start: 2019-07-24 — End: 2019-07-25
  Administered 2019-07-25: 03:00:00 200 mg via ORAL

## 2019-07-24 MED ORDER — DEXMEDETOMIDINE IN 0.9 % NACL 400 MCG/100 ML (4 MCG/ML) IV SOLN
.2-1.5 ug/kg/h | INTRAVENOUS | 0 refills | Status: DC
Start: 2019-07-24 — End: 2019-07-31
  Administered 2019-07-24: 23:00:00 0.4 ug/kg/h via INTRAVENOUS
  Administered 2019-07-25: 20:00:00 0.8 ug/kg/h via INTRAVENOUS
  Administered 2019-07-25: 12:00:00 0.5 ug/kg/h via INTRAVENOUS
  Administered 2019-07-26 (×3): 0.8 ug/kg/h via INTRAVENOUS
  Administered 2019-07-27: 1.2 ug/kg/h via INTRAVENOUS
  Administered 2019-07-27 (×3): 1.5 ug/kg/h via INTRAVENOUS
  Administered 2019-07-27: 22:00:00 1 ug/kg/h via INTRAVENOUS
  Administered 2019-07-28: 23:00:00 1.4 ug/kg/h via INTRAVENOUS
  Administered 2019-07-28: 10:00:00 1.2 ug/kg/h via INTRAVENOUS
  Administered 2019-07-28 (×2): 1.4 ug/kg/h via INTRAVENOUS
  Administered 2019-07-28: 02:00:00 1.3 ug/kg/h via INTRAVENOUS
  Administered 2019-07-28: 06:00:00 1.2 ug/kg/h via INTRAVENOUS
  Administered 2019-07-29: 14:00:00 0.8 ug/kg/h via INTRAVENOUS
  Administered 2019-07-29: 05:00:00 0.6 ug/kg/h via INTRAVENOUS
  Administered 2019-07-29: 20:00:00 1 ug/kg/h via INTRAVENOUS
  Administered 2019-07-30: 08:00:00 1.2 ug/kg/h via INTRAVENOUS
  Administered 2019-07-30: 22:00:00 0.8 ug/kg/h via INTRAVENOUS
  Administered 2019-07-30: 16:00:00 1.2 ug/kg/h via INTRAVENOUS
  Administered 2019-07-30: 02:00:00 1 ug/kg/h via INTRAVENOUS
  Administered 2019-07-30: 12:00:00 1.2 ug/kg/h via INTRAVENOUS
  Administered 2019-07-31 (×2): 0.8 ug/kg/h via INTRAVENOUS

## 2019-07-24 MED ORDER — LIRAGLUTIDE (18 MG/3 ML) SC INJ PEN
1.2 mg | Freq: Every day | SUBCUTANEOUS | 0 refills | Status: DC
Start: 2019-07-24 — End: 2019-07-27

## 2019-07-24 MED ORDER — THIAMINE HCL (VITAMIN B1) 100 MG/ML IJ SOLN
100 mg | Freq: Every day | INTRAVENOUS | 0 refills | Status: DC
Start: 2019-07-24 — End: 2019-07-26
  Administered 2019-07-25: 17:00:00 100 mg via INTRAVENOUS

## 2019-07-24 MED ORDER — MAGNESIUM HYDROXIDE 2,400 MG/10 ML PO SUSP
10 mL | Freq: Once | ORAL | 0 refills | Status: DC
Start: 2019-07-24 — End: 2019-07-25

## 2019-07-24 MED ORDER — CHLORHEXIDINE GLUCONATE 0.12 % MM MWSH
15 mL | Freq: Two times a day (BID) | ORAL | 0 refills | Status: DC
Start: 2019-07-24 — End: 2019-07-31
  Administered 2019-07-25 – 2019-07-31 (×14): 15 mL via ORAL

## 2019-07-24 MED ORDER — IMS MIXTURE TEMPLATE
150 mg | Freq: Two times a day (BID) | ORAL | 0 refills | Status: DC
Start: 2019-07-24 — End: 2019-07-31
  Administered 2019-07-25 – 2019-07-31 (×23): 150 mg via ORAL

## 2019-07-24 MED ORDER — ALTEPLASE 2 MG IK SOLR
2 mg | 0 refills | Status: DC | PRN
Start: 2019-07-24 — End: 2019-08-02

## 2019-07-24 MED ORDER — ALLOPURINOL 300 MG PO TAB
300 mg | Freq: Every day | ORAL | 0 refills | Status: DC
Start: 2019-07-24 — End: 2019-07-25

## 2019-07-24 MED ORDER — LIDOCAINE IN 5 % DEXTROSE (PF) 8 MG/ML (0.8 %) IV SOLP
1 mg/min | INTRAVENOUS | 0 refills | Status: DC
Start: 2019-07-24 — End: 2019-07-25
  Administered 2019-07-24: 20:00:00 1 mg/min via INTRAVENOUS

## 2019-07-24 MED ORDER — SODIUM PHOSPHATE IVPB
30 MMOL | INTRAVENOUS | 0 refills | Status: DC | PRN
Start: 2019-07-24 — End: 2019-07-25

## 2019-07-24 MED ORDER — SODIUM PHOSPHATE IVPB
24 MMOL | INTRAVENOUS | 0 refills | Status: DC | PRN
Start: 2019-07-24 — End: 2019-07-25

## 2019-07-24 MED ORDER — SODIUM BICARBONATE 1 MEQ/ML (8.4 %) IV SOLN
50 meq | Freq: Once | INTRAVENOUS | 0 refills | Status: CP
Start: 2019-07-24 — End: ?
  Administered 2019-07-24: 22:00:00 50 meq via INTRAVENOUS

## 2019-07-24 MED ORDER — POLYETHYLENE GLYCOL 3350 17 GRAM PO PWPK
1 | Freq: Every day | ORAL | 0 refills | Status: DC
Start: 2019-07-24 — End: 2019-07-28
  Administered 2019-07-25 – 2019-07-27 (×3): 17 g via ORAL

## 2019-07-24 MED ORDER — EPINEPHRINE 64MCG/ML IN NS IV DRIP (QUAD CONC)
0-1 ug/kg/min | INTRAVENOUS | 0 refills | Status: DC
Start: 2019-07-24 — End: 2019-07-31
  Administered 2019-07-24 (×2): 0.1 ug/kg/min via INTRAVENOUS
  Administered 2019-07-25 – 2019-07-26 (×4): 0.4 ug/kg/min via INTRAVENOUS
  Administered 2019-07-31 (×2): 0.1 ug/kg/min via INTRAVENOUS

## 2019-07-24 MED ORDER — DOBUTAMINE IN D5W 1,000 MG/250 ML (4,000 MCG/ML) IV SOLP
7.5 ug/kg/min | INTRAVENOUS | 0 refills | Status: DC
Start: 2019-07-24 — End: 2019-07-31
  Administered 2019-07-26: 11:00:00 7.5 ug/kg/min via INTRAVENOUS
  Administered 2019-07-27 – 2019-07-29 (×2): 5 ug/kg/min via INTRAVENOUS
  Administered 2019-07-31: 10:00:00 7.5 ug/kg/min via INTRAVENOUS

## 2019-07-24 MED ORDER — IOHEXOL 350 MG IODINE/ML IV SOLN
60 mL | Freq: Once | INTRAVENOUS | 0 refills | Status: CP
Start: 2019-07-24 — End: ?
  Administered 2019-07-25: 04:00:00 60 mL via INTRAVENOUS

## 2019-07-24 MED ORDER — SODIUM PHOSPHATE IVPB
16 MMOL | INTRAVENOUS | 0 refills | Status: DC | PRN
Start: 2019-07-24 — End: 2019-07-25

## 2019-07-24 MED ORDER — AMIODARONE 150 MG IN D5W 100 ML IVPB LOAD
150 mg | Freq: Once | INTRAVENOUS | 0 refills | Status: CP
Start: 2019-07-24 — End: ?
  Administered 2019-07-24 (×2): 150 mg via INTRAVENOUS

## 2019-07-24 MED ORDER — CHLOROTHIAZIDE SODIUM 500 MG IV SOLR
500 mg | Freq: Once | INTRAVENOUS | 0 refills | Status: CP
Start: 2019-07-24 — End: ?
  Administered 2019-07-24: 21:00:00 500 mg via INTRAVENOUS

## 2019-07-24 MED ORDER — PHENYTOIN SODIUM EXTENDED 100 MG PO CAP
100 mg | ORAL | 0 refills | Status: DC
Start: 2019-07-24 — End: 2019-07-24

## 2019-07-24 MED ORDER — ROCURONIUM 10 MG/ML IV SOLN
100 mg | Freq: Once | INTRAVENOUS | 0 refills | Status: CP
Start: 2019-07-24 — End: ?
  Administered 2019-07-24: 22:00:00 100 mg via INTRAVENOUS

## 2019-07-24 MED ORDER — MIDAZOLAM 1 MG/ML IJ SOLN
1-2 mg | Freq: Once | INTRAVENOUS | 0 refills | Status: DC
Start: 2019-07-24 — End: 2019-07-25

## 2019-07-24 MED ORDER — EZETIMIBE 10 MG PO TAB
10 mg | Freq: Every day | ORAL | 0 refills | Status: DC
Start: 2019-07-24 — End: 2019-07-25

## 2019-07-24 MED ORDER — FENTANYL CITRATE (PF) 50 MCG/ML IJ SOLN
25-50 ug | Freq: Once | INTRAVENOUS | 0 refills | Status: CP
Start: 2019-07-24 — End: ?
  Administered 2019-07-25: 15:00:00 50 ug via INTRAVENOUS

## 2019-07-24 MED ORDER — LIDOCAINE (PF) 10 MG/ML (1 %) IJ SOLN
5 mL | SUBCUTANEOUS | 0 refills | Status: DC
Start: 2019-07-24 — End: 2019-07-25

## 2019-07-24 MED ORDER — PHENYTOIN SODIUM EXTENDED 100 MG PO CAP
100 mg | ORAL | 0 refills | Status: DC
Start: 2019-07-24 — End: 2019-07-25

## 2019-07-24 MED ORDER — LIDOCAINE (PF) 100 MG/5 ML (2 %) IV SYRG
100 mg | Freq: Once | INTRAVENOUS | 0 refills | Status: CP
Start: 2019-07-24 — End: ?
  Administered 2019-07-24: 21:00:00 100 mg via INTRAVENOUS

## 2019-07-24 MED ORDER — CRRT HEPARIN DIALYSATE SOLN 5000ML
INTRAVENOUS_CENTRAL | 0 refills | Status: DC
Start: 2019-07-24 — End: 2019-07-25
  Administered 2019-07-25 (×2): 5000.000 mL via INTRAVENOUS_CENTRAL

## 2019-07-24 MED ORDER — CRRT HEPARIN REPLACEMENT SOLN-PRE 5000ML
INTRAVENOUS_CENTRAL | 0 refills | Status: DC
Start: 2019-07-24 — End: 2019-07-25
  Administered 2019-07-25 (×2): 5000.000 mL via INTRAVENOUS_CENTRAL

## 2019-07-24 MED ORDER — SODIUM CHLORIDE 0.9 % IV SOLP
1000-2000 mL | 0 refills | Status: DC | PRN
Start: 2019-07-24 — End: 2019-07-25

## 2019-07-24 MED ORDER — PANTOPRAZOLE 40 MG IV SOLR
40 mg | Freq: Every day | INTRAVENOUS | 0 refills | Status: DC
Start: 2019-07-24 — End: 2019-07-31
  Administered 2019-07-25 – 2019-07-31 (×7): 40 mg via INTRAVENOUS

## 2019-07-24 MED ORDER — NOREPINEPHRINE 64 MCG/ML IN NS IV DRIP (QUAD CONC)
0-.3 ug/kg/min | INTRAVENOUS | 0 refills | Status: DC
Start: 2019-07-24 — End: 2019-07-31
  Administered 2019-07-24 – 2019-07-25 (×4): 0.5 ug/kg/min via INTRAVENOUS
  Administered 2019-07-25 (×2): 0.36 ug/kg/min via INTRAVENOUS
  Administered 2019-07-26 (×2): 0.5 ug/kg/min via INTRAVENOUS
  Administered 2019-07-26 (×2): 0.46 ug/kg/min via INTRAVENOUS
  Administered 2019-07-27 (×4): 0.28 ug/kg/min via INTRAVENOUS
  Administered 2019-07-29: 01:00:00 0.5 ug/kg/min via INTRAVENOUS
  Administered 2019-07-29: 17:00:00 0.44 ug/kg/min via INTRAVENOUS
  Administered 2019-07-29 (×3): 0.5 ug/kg/min via INTRAVENOUS
  Administered 2019-07-29 – 2019-07-30 (×5): 0.44 ug/kg/min via INTRAVENOUS

## 2019-07-24 MED ORDER — SODIUM BICARBONATE 1MEQ/ML INFUSION
250 meq | INTRAVENOUS | 0 refills | Status: DC
Start: 2019-07-24 — End: 2019-07-25
  Administered 2019-07-24: 250 meq via INTRAVENOUS

## 2019-07-24 MED ORDER — EPINEPHRINE 16MCG/ML IN D5W IV DRIP (STD CONC)
0-1 ug/kg/min | INTRAVENOUS | 0 refills | Status: DC
Start: 2019-07-24 — End: 2019-07-24
  Administered 2019-07-24 (×2): 0.1 ug/kg/min via INTRAVENOUS

## 2019-07-24 MED ORDER — BUMETANIDE 0.25 MG/ML IJ SOLN
2 mg | Freq: Once | INTRAVENOUS | 0 refills | Status: CP
Start: 2019-07-24 — End: ?
  Administered 2019-07-24: 21:00:00 2 mg via INTRAVENOUS

## 2019-07-24 MED ORDER — BUMETANIDE 0.25MG/ML IV DRIP (STD CONC)
1 mg/h | INTRAVENOUS | 0 refills | Status: DC
Start: 2019-07-24 — End: 2019-07-25
  Administered 2019-07-24 – 2019-07-25 (×2): 1 mg/h via INTRAVENOUS

## 2019-07-24 MED ORDER — SENNOSIDES-DOCUSATE SODIUM 8.6-50 MG PO TAB
1 | Freq: Two times a day (BID) | ORAL | 0 refills | Status: DC
Start: 2019-07-24 — End: 2019-07-31
  Administered 2019-07-25 – 2019-07-31 (×13): 1 via ORAL

## 2019-07-24 MED ORDER — NOREPINEPHRINE BITARTRATE-NACL 4 MG/250 ML (16 MCG/ML) IV SOLN
0-.5 ug/kg/min | INTRAVENOUS | 0 refills | Status: DC
Start: 2019-07-24 — End: 2019-07-24

## 2019-07-24 MED ORDER — SODIUM CHLORIDE 0.9 % IJ SOLN
50 mL | Freq: Once | INTRAVENOUS | 0 refills | Status: CP
Start: 2019-07-24 — End: ?
  Administered 2019-07-25: 04:00:00 50 mL via INTRAVENOUS

## 2019-07-24 MED ORDER — MAGNESIUM OXIDE 400 MG (241.3 MG MAGNESIUM) PO TAB
1200 mg | Freq: Two times a day (BID) | ORAL | 0 refills | Status: DC
Start: 2019-07-24 — End: 2019-07-31
  Administered 2019-07-25 – 2019-07-31 (×15): 1200 mg via ORAL

## 2019-07-24 MED ORDER — LIDOCAINE HCL 20 MG/ML (2 %) IJ SOLN
100 mg | Freq: Once | INTRAMUSCULAR | 0 refills | Status: DC
Start: 2019-07-24 — End: 2019-07-24

## 2019-07-24 MED ORDER — EPINEPHRINE 0.1 MG/ML IJ SYRG
0 refills | Status: CP
Start: 2019-07-24 — End: ?
  Administered 2019-07-24: 20:00:00 1 mg via INTRAVENOUS

## 2019-07-24 MED ORDER — INSULIN REGULAR IN 0.9 % NACL 100 UNIT/100 ML (1 UNIT/ML) IV SOLN
1-32 [IU]/h | INTRAVENOUS | 0 refills | Status: DC
Start: 2019-07-24 — End: 2019-07-25

## 2019-07-24 MED ORDER — SODIUM BICARBONATE 1 MEQ/ML (8.4 %) IV SOLN
50 meq | Freq: Once | INTRAVENOUS | 0 refills | Status: CP
Start: 2019-07-24 — End: ?

## 2019-07-24 MED ORDER — LEVOTHYROXINE 25 MCG PO TAB
50 ug | Freq: Every day | ORAL | 0 refills | Status: DC
Start: 2019-07-24 — End: 2019-07-31
  Administered 2019-07-25 – 2019-07-31 (×7): 50 ug via ORAL

## 2019-07-24 MED ADMIN — SODIUM BICARBONATE 1 MEQ/ML (8.4 %) IV SOLN [7309]: 50 meq | INTRAVENOUS | @ 22:00:00 | Stop: 2019-07-24 | NDC 00409662502

## 2019-07-24 MED ADMIN — NOREPINEPHRINE BITARTRATE-NACL 4 MG/250 ML (16 MCG/ML) IV SOLN [173409]: 0.05 ug/kg/min | INTRAVENOUS | @ 20:00:00 | Stop: 2019-07-24 | NDC 70004077140

## 2019-07-24 MED ADMIN — DOBUTAMINE IN D5W 1,000 MG/250 ML (4,000 MCG/ML) IV SOLP [15982]: 5 ug/kg/min | INTRAVENOUS | @ 23:00:00 | Stop: 2019-07-24 | NDC 00338107702

## 2019-07-24 NOTE — Progress Notes
BRIEF EP PROGRESS NOTE    Mr. 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  After coming to the CT ICU he was found to have wide-complex tachycardia with cycle length of 520-540 ms.  He had a cardiac arrest requiring chest compressions and external DC cardioversion.  He was treated for severe hyperkalemia.  Required chest tube for pneumothorax post compression.      Telemetry on arrival-wide-complex tachycardia , sine wave pattern-cycle length 540 ms    Device interrogation: On 07/24/2019-this did not reveal any tachycardia events possibly due to slow VT.  Patient did had a ICD shock in May 22 2019 however EGMs could not be seen.  Device was reprogrammed to detect slow VT and treat appropriately.      Device reprogrammed:  VT detection interval change from 171 bpm to 1 6 2  bpm  VT monitor interval change from 150 bpm to 140 bpm  VF treatment initial beats-change from 38/40 to 18/24  VT detection change from 28 to 16  VT monitor change from 32 to 20     Pertinent lab results reviewed:  Potassium 6.4 mEq/L    Plan:   We will Continue to follow   Full consult pending

## 2019-07-24 NOTE — Progress Notes
Patient admitted yesterday with N/V, abdominal pain.  Found to have acute on chronic renal failure.  Right Lower Lobe consolidation, treated for pneumonia.  Initially and still is hypotensive.  Was treated with IVF, is now volume overloaded.  Would like to speak with Cardiology about diuretic strategy.  Patient is on Rocephen and Zithromycin.  He is tired, but oriented x 4.  EKG shows no acute ischemic changes, paced rhythm.  Not on Heparin.  Initial Troponin 0.35, repeat 0.69  Pro BNP 4579  WBC 7.7  LFT's initially in the 60's today in the 500's.  80/52  90  95-97%  12-16  afebrile.

## 2019-07-24 NOTE — H&P (View-Only)
Heart Failure H&P Note    NAME:Manuel Houston                                                                   MRN: [id number]                 DOB:1955/06/25          AGE: 64 y.o.  ADMISSION DATE: 07/24/2019             DAYS ADMITTED: LOS: 0 days      Chief Complaint:  Evaluation and recommendations re: heart failure.        History of Present Illness: Manuel Houston is a 64 y.o. male that presents as a transfer from OSH with cardiogenic shock. 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 His last LHC in 2017 revealed mild/mod nonobstructive CAD with normal LVEDP. He has a hx of CAD, HTN, HLD, DM2, seizure disorder (last seizure 04/2004)    In January of this year he had an ICD discharge 2/2 V-fib. He's been reporting worsening shortness of breath with minimal physical activity, fatigue, he has not had any presyncope or syncope, he has also been experiencing heart palpitations in particular at night. He is recovering from recent bowel laparotomy.There was some thought that perhaps his stomach upset was related to abdominal fluid retention. His blood pressure is quite a bit lower than usual as well he states.    He was admitted to [facility] on 3/12 for N/V and abdominal pain. He was found to have AKI on CKD (2.8>3.3), RLL consolidation and was treated for PNA with ceftriaxone and zithromycin. He initally was hypotensive (80/50's) and received fluid resusciation. This did not correct his hypotension and is profoundly volume overloaded. ECG at OSH showed no ischemic changes and a paced rhythm. His troponin was 0.35 with repeat 0.69, BNP 4600, significantly rising LFT's, afebrile and WBC of 7.7    Upon arrival to CICU Mr Yahnke was awake and alert however developed rapid progressive decline with a wide-complex tachycardia which on appeared to be slow ventricular tachycardia.  He had cardiac arrest requiring chest compressions and defibrillation achieving ROSC after ~4-5 mins. Central line was placed in groin emergently. His device check showed ventricular tachycardia which had been unrecognized by his device at the slower rate. EP at bedside.  We treated him for severe hyperkalemia with insulin and D50. His chest xray showed a R hydropneumothorax for which a 26fr chest tube was placed. Mr. Goulet developed worsening acidosis, hypoventilation and hypoxemia requiring emergent intubation.  A swan was placed at bedside showing severely elevated filling pressures.  He has AKI on CKD and has become anuric. Nephrology was consulted for CRRT therapy. His LFTs are markedly elevated showing shock liver. We will send him to cath lab for IABP placement.    Mr. Socarras son and daughter are at bedside. Daughter Judeth Cornfield is primary decision maker at this  time.       Principal Problem:    Cardiogenic shock (HCC)  Active Problems:    Alcoholic cardiomyopathy (HCC)    Biventricular implantable cardioverter-defibrillator in situ    DM type 2 (diabetes mellitus, type 2) (HCC)    Seizure disorder (HCC)    CKD (chronic kidney disease) stage 3, GFR 30-59 ml/min (HCC)    Shock liver    Lactic acidosis    Acute respiratory failure with hypoxia and hypercapnia (HCC)    Ventricular tachycardia (HCC)      Plan:  Today:  1. Admission labs  2. Hold PTA GDMT  3. Resume PTA dilantin, allopurinol, ezetimibe, plaquenil, synthroid, victoza, zoloft, topamax   4. SWAN at bedside  5. Cont Epi, norepi, dobutamine, vaso; MAP goal >65  6. IABP placement  7. Chest tube placement   8. Start bicarb gtt 1:1  9. Start bumex gtt  10. Nephrology consulted for CRRT  11. EP following  12. Start amio and lido gtt  13. May need ischemic eval; last LHC 2017  14. ECHO in am  15. Device Eval; showing wide complex VT; rates 120's    Ongoing:  1. BMP three times a day. Magnesium level daily.  Keep Potassium greater than 4.0 and Magnesium greater than 2.0.  2. Follow up appointment with a member of the HF team or PCP within 7 calendar days of discharge.      Assessment   Acute on chronic systolic and diastolic HFrEF,  EF: 15%.  Major Complications or Comorbidities [facility]): acute respiratory failure, cardiogenic shock, acute metabolic/ toxic encephalopathy, acute/ acute on chronic systolic and/or diastolic heart failure and cardiorenal syndrome (with documentation of CKD)  NYHA functional class III (marked limitation of physical activity - comfortable at rest, but less than ordinary activity causes symptoms of HF e.g., getting dressed or standing from a sitting position),   ACC Stage D (refractory HF requiring specialized interventions)  Admission BNP: (!) 3439  Goal Output: 2 L/24hr  Intake/Output:  Date 07/23/19 0700 - 07/24/19 0659(Not Admitted) 07/24/19 0700 - 07/25/19 0659   Shift 0700-0659 24 Hour Total 0700-0659 24 Hour Total   INTAKE   Shift Total       OUTPUT   Urine   15 15   Drains   950 950   Shift Total   965 965   NET   -965 -965   Weight (kg)         Goal Dry Weight:    Admission )Weight: 68 kg (149 lb 14.6 oz)   Most recent weights (inpatient):   Vitals:    07/24/19 1342   Weight: 68 kg (149 lb 14.6 oz)       Diuretic Therapy    Prior to admission dose    Given on admission Bumex 2mg  IV x 1 and Diuril 500mg  x 1   Daily Dosing Bumex gtt     GDMT PTA Name & Dose Changes   HF approved beta blockers: No (Will be addressed at next clinic visit after discussion of risks/benefits with Heart Failure Cardiologist)    ACE/ARB/ Angiotensin II Receptor Blocker Neprilysin Inhibitor: No  Will be addressed at next clinic visit after discussion of risks/benefits with Heart Failure Cardiologist Aldosterone Antagonist: No (Will be addressed at next clinic visit after discussion of risks/benefits with Heart Failure Cardiologist)    Isordil/hydralazine: No (Will be addressed at next clinic visit after discussion of risks/benefits with Heart Failure Cardiologist)    Ivabradine: No; Will  be addressed at next clinic visit after discussion of risks/benefits with Heart Failure Cardiologist    [facility] Device Therapy: Yes (CRT-D)    Anticoagulation for current or history of atrial fibrillation/flutter:       #AKI on CKD  #Lactic Acidosis  #Hyperkalemia   #Acute hypoxic respiratory failure  #Shock liver  #DM2    Fluids, Electrolytes, Nutrition: NPO  Prophylaxis: None   Code: FULL  Disposition: Admit to CICU for cardiogenic shock    Review of Systems:  Review of systems not obtained due to patient factors.    Medical History:   Diagnosis Date   ? Angina     since 05/2005   ? Cardiovascular disease     pacemaker left side, x2 surgeries   ? CHF (congestive heart failure) (HCC)    ? CKD (chronic kidney disease) stage 3, GFR 30-59 ml/min (HCC) 11/07/2010   ? Congestive heart failure, unspecified     Congestive heart failure   ? COPD     pt denies on 07/03/2006   ? Coronary atherosclerosis 03/27/2009   ? Depression    ? DM type 2 (diabetes mellitus, type 2) (HCC) 03/27/2009   ? Endocrine system disease     diabetes   ? Essential hypertension 03/27/2009      2002 - Diagnosis established.    ? HTN    ? Hyperlipidemia 03/27/2009   ? Hypertension 03/27/2009   ? Neurologic disorder     seizures    ? 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 Seizure disorder (HCC) 03/27/2009   ? Seizure disorder (HCC) 03/27/2009   ? Seizures (HCC)    ? Sleep apnea 03/27/2009     Surgical History:   Procedure Laterality Date   ? PR ENUCLEATION EYE IMPLT MUSC X ATTACHED IMPLT  1993    left artificial eye   ? Left Heart Catheterization With Ventriculogram Left 07/06/2015    Performed by Greig Castilla, MD at [facility] CATH LAB   ? Coronary Angiography N/A 07/06/2015    Performed by Greig Castilla, MD at [facility] CATH LAB   ? Possible Percutaneous Coronary Intervention N/A 07/06/2015    Performed by Greig Castilla, MD at [facility] CATH LAB   ? CRT-D Generator Change N/A 04/01/2016    Performed by Smith Robert, MD at [facility] EP LAB   ? Removal ICD: CRT N/A 04/01/2016    Performed by Smith Robert, MD at [facility] EP LAB   ? HX CARPAL TUNNEL RELEASE      bilateral   ? HX HIP REPLACEMENT      left side   ? PR ARTHRP ACETBLR/PROX FEM PROSTC AGRFT/ALGRFT      Hip Replacement, Total   ? TEMPORARY PACEMAKER  2002, 2004     Family History   Problem Relation Age of Onset   ? Diabetes Father    ? Heart Failure Father    ? Hypertension Father    ? Cancer Sister    ? Cancer Mother      Social History     Socioeconomic History   ? Marital status: Divorced     Spouse name: Not on file   ? Number of children: Not on file   ? Years of education: Not on file   ? Highest education level: Not on file   Occupational History   ? Not on file   Tobacco Use   ? Smoking status: Never Smoker   ? Smokeless tobacco: Never Used  Substance and Sexual Activity   ? Alcohol use: No   ? Drug use: No   ? Sexual activity: Not on file   Other Topics Concern   ? Not on file   Social History Narrative   ? Not on file        Objective:    Allergies: No Known Allergies     Medications:  Scheduled Meds:Continuous Infusions:  PRN and Respiratory Meds:    Medications Prior to Admission   Medication Sig Dispense Refill Last Dose   ? allopurinol (ZYLOPRIM) 300 mg tablet Take 300 mg by mouth daily.        ? aspirin EC 81 mg tablet Take one tablet by mouth daily. Take with food. 90 tablet 3    ? calcium carbonate/vitamin D3 (CALCIUM 600 + D PO) Take  by mouth twice daily.      ? carvediloL (COREG) 6.25 mg tablet TAKE 1 TABLET 2 TIMES DAILYWITH FOOD. 180 tablet 3    ? cetirizine (ALLERGY RELIEF (CETIRIZINE)) 10 mg tablet Take 10 mg by mouth every morning.      ? cholecalciferol(+) (VITAMIN D-3) 2,000 unit tablet Take  by mouth at bedtime daily.      ? diclofenac (VOLTAREN) 1 % topical gel Apply 4 g topically to affected area three times daily.      ? ezetimibe (ZETIA) 10 mg tablet TAKE 1 TABLET ONCE DAILY   WITH SIMVASTATIN 90 tablet 3    ? furosemide (LASIX) 40 mg PO Tab take 40 mg by mouth Daily.       ? hydroxychloroquine (PLAQUENIL) 200 mg tablet Take 400 mg by mouth daily. Take with food.      ? levothyroxine (SYNTHROID) 50 mcg tablet Take 50 mcg by mouth daily.      ? liraglutide(+) (VICTOZA) 0.6 mg/0.1 mL (18 mg/3 mL) pnij Inject 1.2 mg under the skin daily.      ? magnesium oxide (MAG-OX) 400 mg (241.3 mg magnesium) tablet Take two tablets by mouth twice daily. (Patient taking differently: Take 1,200 mg by mouth twice daily.) 180 tablet 3    ? melatonin 10 mg tab Take 10 mg by mouth at bedtime daily.      ? metFORMIN (GLUCOPHAGE) 1,000 mg tablet Take 1 tablet by mouth daily.      ? metOLazone (ZAROXOLYN) 2.5 mg tablet Take one tablet by mouth as directed. 2.5mg  1 hr before lasix as needed for dyspnea 30 tablet 3    ? Omega-3 Fatty Acids-Vitamin E (FISH OIL) 1,000 mg PO Cap Take 1,000 mg by mouth daily.      ? omeprazole DR (PRILOSEC) 20 mg capsule Take two capsules by mouth daily before breakfast. 60 capsule 1    ? phenytoin SR (DILANTIN EXTENDED) 100 mg capsule Take 100mg  by mouth twice daily alternating with 100mg  by mouth in the morning and 200mg  in the evening every other day.      ? potassium chloride (K-TAB) 20 mEq tablet Take 40 mEq by mouth twice daily.      ? sertraline (ZOLOFT) 100 mg PO Tab Take 200 mg by mouth at bedtime daily.      ? simvastatin (ZOCOR) 80 mg tablet TAKE 1 TABLET AT BEDTIME   DAILY 90 tablet 1    ? topiramate (TOPAMAX) 50 mg PO tablet take 150 mg by mouth Twice Daily.       ? zolpidem (AMBIEN) 10 mg tablet Take 10 mg by mouth at bedtime as needed for  Sleep.                                Vital Signs:  Last Filed                Vital Signs: 24 Hour Range   Temp: 36.1 ?C (97 ?F) (03/13 1342)  Pulse: 87 (03/13 1526)  Respirations: 15 PER MINUTE (03/13 1526)  Height: 167.6 cm (5' 6) (03/13 1342)  ABP: (96)/(74)   Temp:  [36.1 ?C (97 ?F)]   Pulse:  [87-100]   Respirations:  [15 PER MINUTE-28 PER MINUTE]            Wt Readings from Last 10 Encounters:   07/24/19 68 kg (149 lb 14.6 oz)   07/22/19 66.7 kg (147 lb)   07/22/19 66.9 kg (147 lb 6.4 oz)   06/24/19 67.1 kg (148 lb)   05/27/19 66.2 kg (146 lb)   04/29/19 71 kg (156 lb 9.6 oz)   10/29/18 69.6 kg (153 lb 6.4 oz)   07/17/17 75.5 kg (166 lb 6.4 oz)   12/26/16 73.9 kg (163 lb)   06/27/16 77.5 kg (170 lb 12.8 oz)       Physical Exam:    General Appearance: thin  Skin: warm and dry  Lips & Oral Mucosa: no pallor or cyanosis   Digits and Nails: normal color, smooth symmetric nails and digits  Eyes: conjunctivae and lids normal  Neck Veins: CVP 17cm, positive HJR  Auscultation/Percussion: Intubated, lungs coarse to auscultation, no rales or rhonchi,  Positive for wheezing  Cardiac Auscultation: Paced and VT, S1, S2, no S3 or S4, no murmur  Radial Arteries: normal symmetric radial pulses  Pedal Pulses: pulses 2+, symmetric  Lower Extremity Edema: no   Abdominal Exam: soft, non-tender, bowel sounds normal, no hepatomegaly  Gait & Station: normal balance and gait  Muscle Strength: normal strength and tone  Orientation: clear historian, good insight    Laboratory Review:   CBC w diff    Lab Results   Component Value Date/Time    WBC 8.7 07/24/2019 01:50 PM    RBC 4.53 07/24/2019 01:50 PM    HGB 13.1 (L) 07/24/2019 01:50 PM    HCT 40.7 07/24/2019 01:50 PM    MCV 89.9 07/24/2019 01:50 PM    MCH 29.0 07/24/2019 01:50 PM    MCHC 32.3 07/24/2019 01:50 PM    RDW 17.6 (H) 07/24/2019 01:50 PM    PLTCT 133 (L) 07/24/2019 01:50 PM    MPV 10.1 07/24/2019 01:50 PM    Lab Results   Component Value Date/Time    NEUT 82 (H) 07/24/2019 01:50 PM    ANC 7.14 (H) 07/24/2019 01:50 PM    LYMA 5 (L) 07/24/2019 01:50 PM    ALC 0.40 (L) 07/24/2019 01:50 PM    MONA 13 (H) 07/24/2019 01:50 PM AMC 1.10 (H) 07/24/2019 01:50 PM    EOSA 0 07/24/2019 01:50 PM    AEC 0.00 07/24/2019 01:50 PM    BASA 0 07/24/2019 01:50 PM    ABC 0.02 07/24/2019 01:50 PM         Chemistry    Lab Results   Component Value Date/Time    NA 130 (L) 07/24/2019 01:50 PM    K 6.9 (HH) 07/24/2019 01:50 PM    CL 93 (L) 07/24/2019 01:50 PM    CO2 17 (L) 07/24/2019 01:50 PM    GAP 20 (H) 07/24/2019  01:50 PM    BUN 64 (H) 07/24/2019 01:50 PM    CR 3.45 (H) 07/24/2019 01:50 PM    GLU 173 (H) 07/24/2019 01:50 PM    Lab Results   Component Value Date/Time    CA 9.1 07/24/2019 01:50 PM    ALBUMIN 3.8 07/24/2019 01:50 PM    TOTPROT 5.7 (L) 07/24/2019 01:50 PM    ALKPHOS 177 (H) 07/24/2019 01:50 PM    AST 1,423 (H) 07/24/2019 01:50 PM    ALT 801 (H) 07/24/2019 01:50 PM    TOTBILI 1.0 07/24/2019 01:50 PM    GFR 18 (L) 07/24/2019 01:50 PM    GFRAA 22 (L) 07/24/2019 01:50 PM            Renal Function    Lab Results   Component Value Date/Time    NA 130 (L) 07/24/2019 01:50 PM    K 6.9 (HH) 07/24/2019 01:50 PM    CL 93 (L) 07/24/2019 01:50 PM    CO2 17 (L) 07/24/2019 01:50 PM    GAP 20 (H) 07/24/2019 01:50 PM    BUN 64 (H) 07/24/2019 01:50 PM    BUN 32.0 (H) 07/21/2019    BUN 17.0 06/14/2019    Lab Results   Component Value Date/Time    CR 3.45 (H) 07/24/2019 01:50 PM    CR 2.57 (H) 07/21/2019    CR 1.63 (H) 06/14/2019    GLU 173 (H) 07/24/2019 01:50 PM    CA 9.1 07/24/2019 01:50 PM    ALBUMIN 3.8 07/24/2019 01:50 PM        Lipid Profile INR   Lab Results   Component Value Date    CHOL 156 06/18/2018    TRIG 134 06/18/2018    HDL 51 06/18/2018    LDL 63 06/18/2018    VLDL 27 06/18/2018    CHOLHDLC 3 06/18/2018         Lab Results   Component Value Date    INR 3.2 (H) 07/24/2019          Chest X-Ray: Reviewed      Tele/ECG: Reviewed     Echocardiogram Details:   Echo Results  (Last 3 results in the past 3 years)    Echo EF LVIDD LA Size IVS LVPW Rest PAP    (07/22/19)  20 (07/22/19)  6.7 (07/22/19)  4.0 (07/22/19)  0.6 (07/22/19)  0.8 (07/22/19)  54 I have seen, personally fully evaluated, and discussed this patient with Dr. Vivianne Spence and ICU team.  This patient remains critically ill with cardiogenic shock, AKI on CKD, shock liver requiring IABP and is at high risk for morbidity and mortality. I have spent of critical care time including repeated evaluation and examination of patient on admission and throughout the shift, including: review of hemodynamics, imaging, laboratory data, ventilator management, hemodynamic monitoring and management, lab and radiology review, medication review and management, fluid and electrolyte management, and other pertinent records in the ICU, as well as coordination of care with consulting teams.    [place] Ricahrd Schwager APRN  Cardiology Critical Care    Pager [phone]

## 2019-07-24 NOTE — Progress Notes
Teaching Physician Attestation:  I have personally interviewed and examined the patient, have reviewed the documentation, and agree with the assessment and plan of the resident.  He is critically ill.  He was transferred from [facility] with biventricular heart failure cardiogenic shock with worsening hepatic and renal insufficiency.  He has a history of dilated cardiomyopathy and previous biventricular defibrillator placed back in 2012.  He has diabetes sleep apnea hypothyroidism and chronic kidney disease.  His ejection fraction has been in the 20% range.  His most recent echo a couple of days ago showed marked LV dilatation, severe biventricular dysfunction with ejection fraction in the 20% range with severe mitral regurgitation.  He was admitted thought to perhaps have pneumonia and treated with antibiotics and some diuretics.  His initial Covid test was not reactive.  He developed progressive decline with wide-complex tachycardia which on arrival appeared to be ventricular tachycardia with a sine wave pattern.  He had cardiac arrest requiring chest compressions and defibrillation device check showed ventricular tachycardia which had been unrecognized by his device at the slower rate.  He was treated for severe hyperkalemia and required chest tube for pneumothorax.  He has hypoventilation and hypoxemia and will require mechanical ventilation.  He is going to require hemodynamic monitoring.  He has acute renal insufficiency complicating his hyperkalemia.  He is going to require renal consultation and may need CRRT therapy.  His liver enzymes are markedly increased suggesting hepatic hypoperfusion and shock.  His lactate was 5.4 he is quite tenuous, hemodynamically unstable and critically ill.  I spent more than 45 minutes coordinating his care.  He has a very high risk for decompensation and mortality.  Reubin Milan, MD

## 2019-07-24 NOTE — Other
Anesthesia Inpatient Procedure    INPATIENT PROCEDURE    Date/Time: 07/24/2019 1:00 PM    Patient location: ICU     92950 CARDIOPULMONARY RESUSCITATION (CPR) 92960 CARDIOVERSION.    Additional notes:   Patient noted to be in slow VT upon arrival to the CICU. Placed on pads. Awake and conversant, blood pressure marginal.     Over the course of 20 minutes, blood pressure dwindled and patient lost a pulse.   ACLS was started per protocol. Non-rebreather placed, therapy given for hyperkalemia and acidosis. Lidocaine and Amiodarone started as well.   Patient shocked with 200J, continued in slow VT.   Chest compressions x 2 minutes, Epinephrine 1 mg given.   Patient received second shock of 200J.   Another round of chest compressions performed.   Patient received third shock of 200J, this time converted to underlying paced rhythm with a normal morphology and good peripheral pulses.   After code patient was groggy but arousable and would answer basic questions.   Right-sided pneumothorax noted on post-code CXR, likely from chest compressions as it was not present on admission CXR. I did place a pigtail catheter at bedside (see separate note).     Overall CPR time approximately 5 minutes.         Performed by: Meredith Pel, MD  Authorized by: Meredith Pel, MD

## 2019-07-24 NOTE — Progress Notes
1615 Dr. Cherlynn Kaiser at bedside to prepare for intubation.  1620 2mg  Versed and 100mg  Rocuronium given  1621 pt intubated by Dr. . Bilateral chest rise and breath sounds. STAT chest xray ordered.  Total of of epinephrine given during intubation.

## 2019-07-24 NOTE — Other
Procedure: Airway Placement    AIRWAY INSERTION    Date/Time: 07/24/2019 4:00 PM    Patient location: ICU  Urgency: emergent  Difficult Airway: No        The procedure was performed in an emergent situation.  Verbal consent obtained.  Written consent not obtained.  Consent given by patient.  Risks and benefits discussed: no    Airway Procedure  Indication(s) for airway management: airway protection, cardio/pulmonary arrest, respiratory distress, CNS depression and cardiovascular instability        rapid sequence induction  Level of sedation achieved: Unconscious, no arousal with painful stimuli  Preoxygenated: yes  Patient position: sniffing    Mask difficulty assessment: 1 - vent by mask      Procedure Outcome  Final airway type: endotracheal airway  Endotracheal airway: ETT      ETT size (mm): 8.0;   Technique used for successful ETT placement: direct laryngoscopy    Insertion site: oral  Blade type: Macintosh;   Laryngoscope/Videolaryngoscope blade size: 3  Cormack-Lehane classification: grade IIa - partial view of glottis  Cuffed: yes    Measured from: gums (24 cm)  Number of attempts at approach: 1  Placement verified by auscultation and colorimetric CO2 detector        Complications  Cardiovascular:  Pulmonary:  Procedure:airway not difficult,  Medication:  Additional notes: I performed the entire procedure myself.   Cherlynn Kaiser, MD      Performed by: Meredith Pel, MD  Authorized by: Meredith Pel, MD    Refer to nursing documentation for vitals/monitoring data

## 2019-07-24 NOTE — Progress Notes
Cr 2.8 up to 3.3 today.

## 2019-07-24 NOTE — Other
ARTERIAL LINE (A-Line) PLACEMENT    Indication: Need for emergent hemodynamic monitoring    A time-out was completed verifying correct patient, procedure, site, positioning, and special equipment if applicable. Allens test was performed to ensure adequate perfusion. The patients right wrist was prepped and draped in sterile fashion. 1% Lidocaine was used to anesthetize the area. A 20G Arrow arterial line was introduced into the radial artery. The catheter was threaded over the guide wire and the needle was removed with appropriate pulsatile blood return. The catheter was then stat locked in place to the skin and a sterile dressing applied. Perfusion to the extremity distal to the point of catheter insertion was checked and found to be adequate.     My attending was present for the entire procedure.  Estimated Blood Loss: 5 cc    The patient tolerated the procedure well and there were no complications.    [place] A Izabela Ow, APRN-NP   CICU

## 2019-07-24 NOTE — Other
Anesthesia Inpatient Procedure    INPATIENT PROCEDURE    Date/Time: 07/24/2019 3:51 PM    Patient location: ICU    Preprocedure checklist performed: 2 patient identifiers, risks & benefits discussed, patient evaluated, timeout performed, consent obtained, patient being monitored and sterile drape    Sterile technique:  - Proper hand washing  - Cap, mask  - Sterile gloves  - Skin prep for antisepsis       32556 CHEST TUBE INSERTION (WITHOUT ULTRASOUND).    Procedure Medications  Local Anesthesia: 5 mL lidocaine PF 1% (10 mg/mL)    Additional notes:   14Fr Wayne Drain placed for hydropneumothorax after CPR  Easy placement x 1 attempt using Modified Seldinger technique.   Large amount of transudative-appearing fluid thusfar, 600 mL  Attached to atrium, air leak present    Will confirm placement with chest X-ray        Performed by: Meredith Pel, MD  Authorized by: Meredith Pel, MD

## 2019-07-24 NOTE — Other
Anesthesia Procedure: Central Venous Catheter Line    CENTRAL LINE INSERTION    Date/Time: 07/24/2019 4:45 PM    Patient location: ICU  Indications: hemodynamic pressure monitoring and vascular access    Preprocedure checklist performed: risks & benefits discussed, patient evaluated, timeout performed, patient being monitored and CVC bundle followed (proper hand washing, maximal sterile barrier technique with cap, sterile gown, sterile glove, sterile drape, and skin prep for antisepsis)        CVC Line Insertion Procedure  Skin prepped with chlorhexidine; skin prep agent completely dried prior to procedure.  Patient Position: supine  Location: internal jugular vein  Laterality: right  Vein identification: ultrasound guided  Confirmation of venous placement prior to dilation of vein by: ultrasound  Number of attempts: 1  Successful placement: yes  Patient sedated: yes      Catheter:     Catheter type: introducer placed using standard wire through needle technique     Catheter size: 9 Fr     Insertion depth: 12 cm      Procedure Outcome  Post procedure: all ports aspirated, dressing applied and line sutured; Dressing: chlorhexidine impregnated sponge and sterile occlusive dressing  Placement verification: placement verified by x-ray  Events: none  Observations: patient tolerated well      Additional notes: I was present for the entire procedure performed by a fellow.   No complications noted.   See separate note for information about the PA-Catheter placement.    Cherlynn Kaiser, MD      Performed by: Joaquin Courts, Lucious Groves, MD  Authorized by: Meredith Pel, MD    Anesthesia Procedure: Pulmonary Artery Catheter    PULMONARY ARTERY CATHETER INSERTION    Date/Time: 07/24/2019 5:00 PM  This note is used in conjunction with the CVC Line Insertion note for additional details regarding the insertion of a Pulmonary Artery Catheter:     PA Catheter Insertion Procedure   Catheter type: standard  Insertion depth: 57 cm  Events: none    Additional notes: Note done in conjunction with Introducer placement.     RA 18  RV 42/20  PA 45/32 (38)  PCWP 28 @ 60 cm    Catheter retracted to 57 cm and locked in place.   Mixed venous sent to calculate Fick CO/CI.     I was present for the entire procedure. No complications noted. Easily floated into pulmonary artery.     Cherlynn Kaiser, MD    Performed by: Joaquin Courts, Lucious Groves, MD  Authorized by: Meredith Pel, MD

## 2019-07-24 NOTE — Other
Anesthesia Procedure: Central Venous Catheter Line    CENTRAL LINE INSERTION    Date/Time: 07/24/2019 5:43 PM    Patient location: ICU  Indications: hemodialysis/apheresis    Preprocedure checklist performed: patient evaluated, timeout performed, patient being monitored and CVC bundle followed (proper hand washing, maximal sterile barrier technique with cap, sterile gown, sterile glove, sterile drape, and skin prep for antisepsis)        CVC Line Insertion Procedure  Skin prepped with chlorhexidine; skin prep agent completely dried prior to procedure.  Patient Position: supine  Location: internal jugular vein  Laterality: left  Vein identification: ultrasound guided  Confirmation of venous placement prior to dilation of vein by: ultrasound  Number of attempts: 1  Successful placement: yes  Patient sedated: yes      Catheter:     Catheter type: double lumen placed using standard wire through needle technique     Catheter size: 13.5 [place].     Insertion depth: 20 cm      Procedure Outcome  Post procedure: all ports aspirated, dressing applied and line sutured; Dressing: chlorhexidine impregnated sponge and sterile occlusive dressing  Placement verification: x-ray verification pending  Events: none  Observations: patient tolerated well      Additional notes: I performed the procedure myself.     Cherlynn Kaiser, MD      Performed by: Meredith Pel, MD  Authorized by: Meredith Pel, MD

## 2019-07-24 NOTE — Other
Anesthesia Procedure: Central Venous Catheter Line    CENTRAL LINE INSERTION    Date/Time: 07/24/2019 2:55 PM    Patient location: ICU  Indications: medications requiring CV access and inadequate peripheral IV access    Preprocedure checklist performed: 2 patient identifiers, patient evaluated, patient being monitored and CVC bundle followed (proper hand washing, maximal sterile barrier technique with cap, sterile gown, sterile glove, sterile drape, and skin prep for antisepsis)        CVC Line Insertion Procedure  Skin prepped with chlorhexidine; skin prep agent completely dried prior to procedure.  Patient Position: supine  Location: femoral vein  Laterality: right  Vein identification: ultrasound guided  Confirmation of venous placement prior to dilation of vein by: ultrasound  Number of attempts: 1  Successful placement: yes      Patient Sedated: no      Catheter:     Catheter type: triple lumen placed using standard wire through needle technique     Catheter size: 7 Fr     Insertion depth: 20 cm      Procedure Outcome  Post procedure: all ports aspirated, dressing applied and line sutured; Dressing: sterile occlusive dressing and chlorhexidine impregnated sponge  Events: none  Observations: patient tolerated well      Additional notes: Placed in urgent/emergent situation for better central venous access.     I was present for the entire procedure. No complications noted.   Cherlynn Kaiser, MD      Performed by: Joaquin Courts, Lucious Groves, MD  Authorized by: Meredith Pel, MD

## 2019-07-25 ENCOUNTER — Inpatient Hospital Stay: Admit: 2019-07-25 | Discharge: 2019-07-25 | Payer: MEDICARE

## 2019-07-25 LAB — BASIC METABOLIC PANEL CELLULAR THERAPEUTICS
Lab: 136 MMOL/L — ABNORMAL LOW (ref 137–147)
Lab: 14 MMOL/L — ABNORMAL LOW (ref 21–30)
Lab: 18 mL/min — ABNORMAL LOW (ref >60)
Lab: 188 mg/dL — ABNORMAL HIGH (ref 70–100)
Lab: 21 mL/min — ABNORMAL LOW (ref >60)
Lab: 26 MMOL/L — ABNORMAL HIGH (ref 3–12)
Lab: 3.5 mg/dL — ABNORMAL HIGH (ref 0.4–1.24)
Lab: 4.6 MMOL/L — ABNORMAL HIGH (ref 3.5–5.1)
Lab: 68 mg/dL — ABNORMAL HIGH (ref 7–25)
Lab: 9.3 mg/dL (ref 8.5–10.6)
Lab: 96 MMOL/L — ABNORMAL LOW (ref 98–110)

## 2019-07-25 LAB — POC GLUCOSE
Lab: 218 mg/dL — ABNORMAL HIGH (ref 70–100)
Lab: 188 mg/dL — ABNORMAL HIGH (ref 70–100)

## 2019-07-25 LAB — O2 SATURATION, MIXED VENOUS: Lab: 66 % — ABNORMAL LOW (ref 35–45)

## 2019-07-25 LAB — LACTIC ACID (BG - RAPID LACTATE): Lab: 8.9 MMOL/L — ABNORMAL HIGH (ref 0.5–2.0)

## 2019-07-25 LAB — LACTIC ACID(LACTATE): Lab: 9.8 MMOL/L — ABNORMAL HIGH (ref 0.5–2.0)

## 2019-07-25 LAB — PHOSPHORUS  CELLULAR THERAPEUTICS: Lab: 7.1 mg/dL — ABNORMAL HIGH (ref 2.0–4.5)

## 2019-07-25 LAB — MAGNESIUM  CELLULAR THERAPEUTICS: Lab: 2.4 mg/dL (ref 1.6–2.6)

## 2019-07-25 LAB — BLOOD GASES, ARTERIAL: Lab: 7.3 (ref 7.35–7.45)

## 2019-07-25 LAB — IONIZED CALCIUM,BG: Lab: 1.1 MMOL/L (ref 1.0–1.3)

## 2019-07-25 MED ORDER — CALCIUM CHLORIDE 8G/NACL 0.9% IV SOLN 1000ML
8 g | INTRAVENOUS | 0 refills | Status: DC
Start: 2019-07-25 — End: 2019-07-29
  Administered 2019-07-25 – 2019-07-29 (×22): 8 g via INTRAVENOUS

## 2019-07-25 MED ORDER — CRRT CITRATE REPLACEMENT SOLN-POST 5000ML
INTRAVENOUS_CENTRAL | 0 refills | Status: DC
Start: 2019-07-25 — End: 2019-07-26
  Administered 2019-07-25 – 2019-07-26 (×6): 5000.000 mL via INTRAVENOUS_CENTRAL

## 2019-07-25 MED ORDER — SODIUM BICARBONATE 1MEQ/ML INFUSION
250 meq | INTRAVENOUS | 0 refills | Status: DC
Start: 2019-07-25 — End: 2019-07-25
  Administered 2019-07-25: 12:00:00 250 meq via INTRAVENOUS

## 2019-07-25 MED ORDER — VANCOMYCIN 1,250 MG IVPB
1250 mg | Freq: Once | INTRAVENOUS | 0 refills | Status: CP
Start: 2019-07-25 — End: ?
  Administered 2019-07-25 (×2): 1250 mg via INTRAVENOUS

## 2019-07-25 MED ORDER — MAGNESIUM SULFATE IN D5W 1 GRAM/100 ML IV PGBK
1 g | INTRAVENOUS | 0 refills | Status: CP
Start: 2019-07-25 — End: ?
  Administered 2019-07-26: 07:00:00 1 g via INTRAVENOUS

## 2019-07-25 MED ORDER — ALLOPURINOL 100 MG PO TAB
100 mg | Freq: Every day | ORAL | 0 refills | Status: DC
Start: 2019-07-25 — End: 2019-07-31
  Administered 2019-07-25 – 2019-07-31 (×7): 100 mg via ORAL

## 2019-07-25 MED ORDER — PHENYTOIN 125 MG/5 ML PO SUSP
100 mg | GASTROSTOMY | 0 refills | Status: DC
Start: 2019-07-25 — End: 2019-07-25

## 2019-07-25 MED ORDER — CHLOROTHIAZIDE SODIUM 500 MG IV SOLR
250 mg | Freq: Once | INTRAVENOUS | 0 refills | Status: CP
Start: 2019-07-25 — End: ?
  Administered 2019-07-25: 09:00:00 250 mg via INTRAVENOUS

## 2019-07-25 MED ORDER — LEVETIRACETAM 500 MG/5 ML IV SOLN
1000 mg | Freq: Two times a day (BID) | INTRAVENOUS | 0 refills | Status: DC
Start: 2019-07-25 — End: 2019-07-31
  Administered 2019-07-26 – 2019-07-31 (×12): 1000 mg via INTRAVENOUS

## 2019-07-25 MED ORDER — SODIUM PHOSPHATE IVPB
16 MMOL | INTRAVENOUS | 0 refills | Status: DC | PRN
Start: 2019-07-25 — End: 2019-07-29
  Administered 2019-07-27 – 2019-07-28 (×6): 16 mmol via INTRAVENOUS

## 2019-07-25 MED ORDER — SODIUM BICARBONATE 1MEQ/ML INFUSION
250 meq | INTRAVENOUS | 0 refills | Status: DC
Start: 2019-07-25 — End: 2019-07-25
  Administered 2019-07-25: 07:00:00 250 meq via INTRAVENOUS

## 2019-07-25 MED ORDER — NALOXONE 0.4 MG/ML IJ SOLN
.08 mg | INTRAVENOUS | 0 refills | Status: DC | PRN
Start: 2019-07-25 — End: 2019-08-02

## 2019-07-25 MED ORDER — ENOXAPARIN 30 MG/0.3 ML SC SYRG
30 mg | Freq: Every day | SUBCUTANEOUS | 0 refills | Status: DC
Start: 2019-07-25 — End: 2019-07-25

## 2019-07-25 MED ORDER — SODIUM PHOSPHATE IVPB
30 MMOL | INTRAVENOUS | 0 refills | Status: DC | PRN
Start: 2019-07-25 — End: 2019-07-29

## 2019-07-25 MED ORDER — PHENYTOIN 125 MG/5 ML PO SUSP
200 mg | GASTROSTOMY | 0 refills | Status: DC
Start: 2019-07-25 — End: 2019-07-25

## 2019-07-25 MED ORDER — FENTANYL CITRATE (PF) 50 MCG/ML IJ SOLN
50 ug | Freq: Once | INTRAVENOUS | 0 refills | Status: CP
Start: 2019-07-25 — End: ?
  Administered 2019-07-25: 17:00:00 50 ug via INTRAVENOUS

## 2019-07-25 MED ORDER — HYDROCORTISONE SOD SUCC (PF) 100 MG/2 ML IJ SOLR
50 mg | INTRAVENOUS | 0 refills | Status: DC
Start: 2019-07-25 — End: 2019-07-31
  Administered 2019-07-25 – 2019-07-31 (×25): 50 mg via INTRAVENOUS

## 2019-07-25 MED ORDER — CALCIUM GLUCONATE 1G/100ML IVPB (MB+)
1 g | INTRAVENOUS | 0 refills | Status: DC | PRN
Start: 2019-07-25 — End: 2019-07-29
  Administered 2019-07-25 (×2): 1 g via INTRAVENOUS

## 2019-07-25 MED ORDER — VANCOMYCIN 1G/250ML D5W IVPB (VIAL2BAG)
15 mg/kg | INTRAVENOUS | 0 refills | Status: DC
Start: 2019-07-25 — End: 2019-07-27
  Administered 2019-07-26 (×2): 1000 mg via INTRAVENOUS

## 2019-07-25 MED ORDER — CITRATE DEXTROSE SOLUTION MISC SOLN
1000 mL | 0 refills | Status: DC
Start: 2019-07-25 — End: 2019-07-29
  Administered 2019-07-25 – 2019-07-29 (×25): 1000 mL

## 2019-07-25 MED ORDER — POTASSIUM CHLORIDE 20 MEQ/15 ML PO LIQD
40 meq | Freq: Once | ORAL | 0 refills | Status: CP
Start: 2019-07-25 — End: ?
  Administered 2019-07-26: 06:00:00 40 meq via ORAL

## 2019-07-25 MED ORDER — SODIUM PHOSPHATE IVPB
24 MMOL | INTRAVENOUS | 0 refills | Status: DC | PRN
Start: 2019-07-25 — End: 2019-07-29

## 2019-07-25 MED ORDER — SODIUM BICARBONATE 1MEQ/ML INFUSION
250 meq | INTRAVENOUS | 0 refills | Status: AC
Start: 2019-07-25 — End: ?
  Administered 2019-07-25 – 2019-07-26 (×2): 250 meq via INTRAVENOUS

## 2019-07-25 MED ORDER — MAGNESIUM SULFATE IN D5W 1 GRAM/100 ML IV PGBK
1 g | INTRAVENOUS | 0 refills | Status: CP
Start: 2019-07-25 — End: ?
  Administered 2019-07-26: 06:00:00 1 g via INTRAVENOUS

## 2019-07-25 MED ORDER — VANCOMYCIN PHARMACY TO MANAGE
1 | 0 refills | Status: DC
Start: 2019-07-25 — End: 2019-07-27

## 2019-07-25 MED ORDER — PHENYTOIN 125 MG/5 ML PO SUSP
100 mg | Freq: Every day | GASTROSTOMY | 0 refills | Status: DC
Start: 2019-07-25 — End: 2019-07-25
  Administered 2019-07-25: 15:00:00 100 mg via GASTROSTOMY

## 2019-07-25 MED ORDER — CRRT CITRATE DIALYSATE SOLUTION 5000ML
INTRAVENOUS_CENTRAL | 0 refills | Status: DC
Start: 2019-07-25 — End: 2019-07-26
  Administered 2019-07-25 – 2019-07-26 (×7): 5000.000 mL via INTRAVENOUS_CENTRAL

## 2019-07-25 MED ORDER — CEFEPIME 2G/100ML NS IVPB (MB+)
2 g | INTRAVENOUS | 0 refills | Status: DC
Start: 2019-07-25 — End: 2019-07-27
  Administered 2019-07-25 – 2019-07-26 (×10): 2 g via INTRAVENOUS

## 2019-07-25 MED ORDER — FENTANYL DRIP IN NS 1000MCG/100ML
10-100 ug/h | INTRAVENOUS | 0 refills | Status: DC
Start: 2019-07-25 — End: 2019-07-31
  Administered 2019-07-25: 18:00:00 75 ug/h via INTRAVENOUS
  Administered 2019-07-26: 23:00:00 90 ug/h via INTRAVENOUS
  Administered 2019-07-26: 13:00:00 75 ug/h via INTRAVENOUS
  Administered 2019-07-26: 22:00:00 50 ug/h via INTRAVENOUS
  Administered 2019-07-26: 02:00:00 75 ug/h via INTRAVENOUS
  Administered 2019-07-27: 07:00:00 100 ug/h via INTRAVENOUS
  Administered 2019-07-27: 19:00:00 75 ug/h via INTRAVENOUS
  Administered 2019-07-28 – 2019-07-29 (×3): 50 ug/h via INTRAVENOUS
  Administered 2019-07-30: 13:00:00 90 ug/h via INTRAVENOUS
  Administered 2019-07-30: 02:00:00 80 ug/h via INTRAVENOUS
  Administered 2019-07-31: 14:00:00 30 ug/h via INTRAVENOUS

## 2019-07-25 MED ORDER — SODIUM CHLORIDE 0.9 % IV SOLP
0 refills | Status: DC | PRN
Start: 2019-07-25 — End: 2019-07-29

## 2019-07-25 MED ADMIN — WATER FOR INJECTION, STERILE IJ SOLN [79513]: 20 mL | INTRAVENOUS | @ 09:00:00 | Stop: 2019-07-25 | NDC 00409488723

## 2019-07-25 MED ADMIN — FENTANYL CITRATE (PF) 50 MCG/ML IJ SOLN [3037]: 100 ug | INTRAVENOUS | @ 18:00:00 | Stop: 2019-07-25 | NDC 00409909412

## 2019-07-25 NOTE — Progress Notes
Dr. Elgie Collard notified of FICK result.       07/24/19 2200   Critical Care Vitals Adult   CVP (!) 13 MM HG   Hemodynamic Monitoring   PA Pressure (!) 44/21   PA Mean (mm Hg) (!) 30 mm Hg   Device Type Standard PA Catheter   CO (!) 3.72   Cardiac Output Method Indirect Fick Calculation   CI (!) 2.09   SV (Calculated) (!) 44   SVI (Calculated) (!) 25   RVSWI (Calculated) 6

## 2019-07-25 NOTE — Progress Notes
07/24/19 1650   Critical Care Vitals Adult   CVP (!) 16 MM HG   Hemodynamic Monitoring   PA Pressure (!) 47/33   PA Mean (mm Hg) (!) 38 mm Hg   Device Type Standard PA Catheter   CO (!) 1.78   Cardiac Output Method Indirect Fick Calculation   CI (!) 1   SV (Calculated) (!) 16   SVI (Calculated) (!) 9   SVR (Calculated) (!) 2951   SVRI (Calculated) (!) 5120   RVSWI (Calculated) (!) 3       JShaw, APRN and Dr. Cherlynn Kaiser notified of FICK results above. Plans for pt to go to [facility] for IABP placement.     Current gtts:    Amiodarone 1 mg/min  Lidocaine 1 mg/min  Epinephrine 0.1 mcg/kg/min  Norepinephrine 0.5 mcg/kg/min  Dobutamine 5 mcg/kg/min  Vasopressin 3.6 units/hr

## 2019-07-25 NOTE — Acute Stroke Response
NAME:Manuel Houston             MRN: [id number]             DOB:01-Oct-1955          AGE: 64 y.o.  ADMISSION DATE: 07/24/2019             DAYS ADMITTED: LOS: 0 days    Date of Service:  07/24/2019    Allergies:  Sulfa (sulfonamide antibiotics)    Type of Acute Stroke Response Team note: Consult    Stroke Activation Tier: Tier 1       Assessment & Plan     Chief Complaint: Loss of pupil response in left eye    Assessment: Manuel Houston is a 64 y.o. 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 Initially treated for pneumonia, he progressed to cardiogenic shock in setting of biventricular heart failure. At approximately 1400 he went into pulseless V-tach and was coded for 3-4 minutes. Pulse was reestablished and the patient was reportedly awake and verbal afterwards but had severe hypotension. He underwent chest tube insertion due to post code pneumothorax. He was then taken for cardiac cath and IABP placement where he was intubated.    Later in the cardiac ICU nursing found him with a fixed Left pupil and no cough or gag reflex on their exam. Stroke team was activated. Right eye had corneal reflex and reactive pupil 4-21mm. Cough and gaga were absent with ET tube suctioning. No oculocephalic reflex. No spontaneous, withdraw, localizing, or reflexive movements in any of his extremities. He was not overbreathing the ventilator. Patient was taken for CT head in the ER which took a while to obtain due to the extensive drips, ventilator, and balloon pump machine that required careful handling.     CT head was negative for hemorrhage or other acute process. 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 CTA head and neck was completed and there was no large vessel occlusion, significant stenosis, or dissection.     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 Also consider delayed recovery from anesthesia.    Suspected localization of Stroke Sx: N/A    Suspected etiology: Stroke of other determined etiology (e.g. vasculitis arterial disection etc.)    Pre-event mRS: 1 - No significant disability despite symptoms; able to carry out all usual duties and activities     Plan:   - No acute stroke intervention  - Recommend SBP >100 to maintain cerebral perfusion  - MRI head w/o contrast when able  - EEG might be considered at a later time but likely low yield at this point  - Stroke service will follow along and provide further recs when needed    The patient was seen and discussed with Dr. Murvin Donning, APRN-NP  Neurology, [phone]    History of Present Ilness     History of Present Illness:   Mr. Ricketts admitted from OSH on 3/13 with acute on chronic renal failure with worsening shortness of breath, nausea, vomiting and abdominal pain on 3/13. Initially treated for pneumonia, he progressed to cardiogenic shock in setting of biventricular heart failure. At approximately 1400 he went into  pulseless V-tach and was coded for 3-4 minutes. Pulse was reestablished and the patient was reportedly awake and verbal afterwards but had severe hypotension. He underwent chest tube insertion due to post code pneumothorax. He was then taken for cardiac cath and IABP placement where he was intubated. Later in the cardiac ICU nursing found him with a fixed Left pupil and no cough or gag reflex on their exam. Stroke team was activated.     Review of Systems     Review of systems not obtained from patient due to patient factors.        Stroke Activation Summary        ASRT Arrival: 2039  Location of Response : [facility] 909   Page Received: 2037    Clinical Presentation: Right sided weakness, Left sided weakness, Decreased LOC, Other (comment)(loss of cough, gag, and no response to pain in extremities)    Signs & Symptoms: Last Known Well  Last Known Well - Date: 07/24/19  Last Known Well - Time: 1700     CT/CTP/CTA:   Door to CT Reason for Delay: delay getting to scanner due to coordination of patient equipment (balloon pump, vent, multiple IV poles) and ensuring patient stability for transport    BP: 71/61 (03/13 1400)  Temp: 35 ?C (95 ?F) (03/13 2300)  Pulse: 78 (03/13 2300)  Respirations: 16 PER MINUTE (03/13 2300)  SpO2: 100 % (03/13 2300)  SpO2 Pulse: 78 (03/13 2300)  Height: 167.6 cm (66) (03/13 1342)    NIHSS Completed at: 2050      NIH Stroke Scale Item  Scoring Definition  Score    1a. LOC  0=alert and responsive   1=arousable to minor stimulation   2=arousable only to painful stimulation   3=reflex responses or unrousable  3   1b. LOC questions-as patient?s age and month. Must be exact.  0=both correct   1=one correct (or dysarthria, intubated, foreign language)   2=neither correct  2   1c. Commands-open/close eyes, grip and release non-paretic hand (other 1 step commands or mimic OK)  0=both correct (ok if impaired by weakness)   1=one correct   2=neither correct  2    2. Best Gaze-horizontal EOM by voluntary or Doll?s  0=normal   1=partial gaze palsy (abnormal gaze in one or both eyes)   2=forced eye deviation or total paresis which cannot be overcome by Doll?s  0    3. Visual Field-use visual threat if necessary. If monocular, score field of good eye  0=no visual loss   1=partial hemianopia, quadrantanopia, extinction   2=complete hemianopia   3=bilateral hemianopia or blindness  3    4. Facial Palsy-if stuporous, check symmetry of grimace to pain  0=normal   1=minor paralysis, flat NLF, asymm smile   2=partial paralysis (lower face=UMN)   3=complete paralysis (upper and lower face)  0    5. Motor Arm-arms outstretched 90 deg (sitting) or 45 deg (supine) for 10 seconds. Encourage best effort.  0=no drift x 10 seconds   1=drift but doesn?t hit bed   2=some antigravity effort, but can?t sustain   3=no antigravity effort, but even minimal mvt counts   4=no movement at all   X=unable to assess due to amputation, fusion, etc  L/R   4/4    6. Motor Leg-raise leg to 30 degrees supine x 5 seconds  0=no drift x 5 seconds   1=drift but doesn?t hit bed   2=some antigravity effort, but can?t sustain  3=no antigravity effort, but even minimal mvt counts   4=no movement at all   X=unable to assess due to amputation, fusion, etc  L/R   4/4    7. Limb Ataxia-check finger-nose- finger; heel-shin; and score only if out of proportion to paralysis  0=no ataxia (or aphasic, hemiplegic)   1=ataxia in upper or lower extremity   2=ataxia in upper AND lower extremity   X=unable to assess due to amputation, fusion, etc  0   8. Sensory-use safety pin. Check grimace or withdrawal if stuporous. Score only stroke- related losses  0=normal   1=mild-mod unilateral loss but patient aware of touch 9or aphasic, confused)   2=total loss, pt unaware of touch. Coma, bilateral loss  2   9. Best Language-describe cookie jar picture, name objects, read sentences. May use repeating, writing, stereognosis  0=normal   1=mild-mod aphasia (diff but partly comprehensible)   2=severe aphasia (almost no info exchanged)   3=mute, global aphasia, coma. No 1 step commands  3   intubated   10. Dysarthria-read list of words  0=normal   1=mild-mod; slurred but intelligible   2=severe; unintelligible or mute  2    11. Extinction/Neglect- simultaneously touch patient on both hands, show fingers in both visual fields, ask about deficit, left hand  0=normal, none detected. (visual loss alone)   1=neglects or extinguishes to double simult stimulation in any modality   2=profound neglect in more than one modality  2   Score    35       Was IV tPA given? No    The patient was not a tPA candidate due to Other - INR 3.2    Advanced imaging was interpreted at 2150  The patient was not a thrombectomy candidate due to no LVO    Dysphagia screen:  Did Patient Pass The Swallow Screen Part I?: No  If No Name of Physician Notified: Jen Mow, APRN                Failed screen by nursing staff and awaiting ST evaluation     Cardiac rhythm on presentation: sr         Health History     Medical History:   Diagnosis Date   ? Angina     since 05/2005   ? Cardiovascular disease     pacemaker left side, x2 surgeries   ? CHF (congestive heart failure) (HCC)    ? CKD (chronic kidney disease) stage 3, GFR 30-59 ml/min (HCC) 11/07/2010   ? Congestive heart failure, unspecified     Congestive heart failure   ? COPD     pt denies on 07/03/2006   ? Coronary atherosclerosis 03/27/2009   ? Depression    ? DM type 2 (diabetes mellitus, type 2) (HCC) 03/27/2009   ? Endocrine system disease     diabetes   ? Essential hypertension 03/27/2009      2002 - Diagnosis established.    ? HTN    ? Hyperlipidemia 03/27/2009   ? Hypertension 03/27/2009   ? Neurologic disorder     seizures    ? 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 Seizure disorder (HCC) 03/27/2009   ? Seizure disorder (HCC) 03/27/2009   ? Seizures (HCC)    ? Sleep apnea 03/27/2009     Surgical History:   Procedure Laterality Date   ? PR ENUCLEATION EYE IMPLT MUSC X ATTACHED IMPLT  1993    left artificial eye   ?  Left Heart Catheterization With Ventriculogram Left 07/06/2015    Performed by Greig Castilla, MD at [facility] CATH LAB   ? Coronary Angiography N/A 07/06/2015    Performed by Greig Castilla, MD at [facility] CATH LAB   ? Possible Percutaneous Coronary Intervention N/A 07/06/2015    Performed by Greig Castilla, MD at [facility] CATH LAB   ? CRT-D Generator Change N/A 04/01/2016    Performed by Smith Robert, MD at [facility] EP LAB   ? Removal ICD: CRT N/A 04/01/2016    Performed by Smith Robert, MD at [facility] EP LAB   ? HX CARPAL TUNNEL RELEASE      bilateral   ? HX HIP REPLACEMENT      left side   ? PR ARTHRP ACETBLR/PROX FEM PROSTC AGRFT/ALGRFT      Hip Replacement, Total   ? TEMPORARY PACEMAKER  2002, 2004     Family History   Problem Relation Age of Onset   ? Diabetes Father    ? Heart Failure Father    ? Hypertension Father    ? Cancer Sister    ? Cancer Mother      Social History     Socioeconomic History   ? Marital status: Divorced     Spouse name: Not on file   ? Number of children: Not on file   ? Years of education: Not on file   ? Highest education level: Not on file   Occupational History   ? Not on file   Tobacco Use   ? Smoking status: Never Smoker   ? Smokeless tobacco: Never Used   Substance and Sexual Activity   ? Alcohol use: No   ? Drug use: No   ? Sexual activity: Not on file   Other Topics Concern   ? Not on file   Social History Narrative   ? Not on file        Medications:  [START ON 07/25/2019] allopurinoL (ZYLOPRIM) tablet 300 mg, 300 mg, Oral, QDAY  amiodarone (CORDARONE) 360 mg in dextrose, iso-osm 200 mL infusion (Cabinet Override), , , NOW  CALCIUM CHLORIDE 100 MG/ML (10 %) IV SYRG (Cabinet Override), , , NOW  chlorhexidine gluconate (PERIDEX) 0.12 % solution 15 mL, 15 mL, Swish & Spit, BID  [START ON 07/25/2019] ezetimibe (ZETIA) tablet 10 mg, 10 mg, Oral, QDAY  FENTANYL CITRATE (PF) 50 MCG/ML IJ SOLN (Cabinet Override), , , NOW  fentaNYL citrate PF (SUBLIMAZE) injection 25-50 mcg, 25-50 mcg, Intravenous, ONCE  [START ON 07/25/2019] hydrOXYchloroQUINE (PLAQUENIL) tablet 400 mg, 400 mg, Oral, QDAY  insulin aspart U-100 (NOVOLOG FLEXPEN) injection PEN 0-6 Units, 0-6 Units, Subcutaneous, ACHS (22)  [START ON 07/25/2019] levothyroxine (SYNTHROID) tablet 50 mcg, 50 mcg, Oral, QDAY  LIDOCAINE HCL 10 MG/ML (1 %) IJ SOLN (Cabinet Override), , , NOW  lidocaine PF 1% (10 mg/mL) injection 5 mL, 5 mL, Subcutaneous,   [START ON 07/25/2019] liraglutide (VICTOZA) injection pen 1.2 mg, 1.2 mg, Subcutaneous, QDAY  magnesium oxide (MAG-OX) tablet 1,200 mg, 1,200 mg, Oral, BID  midazolam (VERSED) injection 1-2 mg, 1-2 mg, Intravenous, ONCE  MIDAZOLAM 1 MG/ML IJ SOLN (Cabinet Override), , , NOW  milk of magnesia (CONC) oral suspension 10 mL, 10 mL, Oral, ONCE  pantoprazole (PROTONIX) injection 40 mg, 40 mg, Intravenous, QDAY  [START ON 07/25/2019] phenytoin SR (DILANTIN) capsule 100 mg, 100 mg, Oral, Q48H*    And  [START ON 07/25/2019] phenytoin SR (DILANTIN) capsule 100 mg, 100 mg, Oral, QDAY  And  phenytoin SR (DILANTIN) capsule 200 mg, 200 mg, Oral, Q48H*  polyethylene glycol 3350 (MIRALAX) packet 17 g, 1 packet, Oral, QDAY  senna/docusate (SENOKOT-S) tablet 1 tablet, 1 tablet, Oral, BID  sertraline (ZOLOFT) tablet 200 mg, 200 mg, Oral, QHS  SODIUM CHLORIDE 0.9 % IV SOLP (Cabinet Override), , , NOW  SODIUM CHLORIDE 0.9 % IV SOLP (Cabinet [place]), , , NOW  [START ON 07/25/2019] thiamine (VITAMIN B-1) injection 100 mg, 100 mg, Intravenous, QDAY  topiramate (TOPAMAX) tablet 150 mg, 150 mg, Oral, BID  WATER FOR INJECTION, STERILE IJ SOLN (Cabinet Override), , , NOW        PRN Medications:  alteplase PRN (On Call from Rx), anticoagulant sodium citrate PRN (On Call from Rx), sodium chloride 0.9% (NS) PRN, sodium phosphate  IVPB PRN (On Call from Rx) **OR** sodium phosphate  IVPB PRN (On Call from Rx) **OR** sodium phosphate  IVPB PRN (On Call from Rx)       Physical Exam     HEENT: normocephalic, eyes open with no discharge, nares patent, oropharynx is clear with no lesions, palate intact  CV: regular rate, distal pulses palpable  Chest: normal configuration, equal chest rise bilaterally  Ab: soft, non-tender, no masses, no organomegaly  Skin: no rashes or lesions    Extended Neuro Exam:    Mental status: unresponsive and intubated  Speech:    Normal Abnormal   Fluency  x   Comprehension  x   Articulation  x   Repetition  x   Naming  x       Cranial Nerves:    Normal Abnormal   II x    III, IV, VI  x   V x    VII                         x                             VIII x    IX, X  x   XI x    XII x        Muscle/motor:   Tone: flaccid  Bulk: nml  Fasciculations: none       NF NE SA EF EE WE WF FF FE FA TA HF HA HE KF KE DF PF   R   0 0 0   0 0   0   0 0 0 0   L   0 0 0   0 0   0   0 0 0 0       Sensation:    Normal RUE LUE RLE LLE   Light Touch  x x x x   Pin Prick  x x x x   Temperature        Vibration        Proprioception            Coordination: Unable to assess due to patient condition   Normal Abnormal Right Abnormal Left   Finger to Nose      Rapid alternating       Heel to [employer] tap      Foot tap        Gait and Sation:  Deferred due to patient condition    Reflexes:    Right Left   Triceps 0 0   Biceps  0 0   Brachioradialis 0 0   Patella 0 0   Ankle 0 0   Plantar absent absent          Lab/Radiology/Other Diagnostic Tests:  Hematology:    Lab Results   Component Value Date    HGB 13.1 07/24/2019    HCT 40.7 07/24/2019    PLTCT 133 07/24/2019    WBC 8.7 07/24/2019    NEUT 82 07/24/2019    ANC 7.14 07/24/2019    ALC 0.40 07/24/2019    MONA 13 07/24/2019    AMC 1.10 07/24/2019    ABC 0.02 07/24/2019    MCV 89.9 07/24/2019    MCHC 32.3 07/24/2019    MPV 10.1 07/24/2019    RDW 17.6 07/24/2019   , Coagulation:    Lab Results   Component Value Date    PT 15.1 04/02/2006    PTT 26.2 04/02/2006    INR 3.2 07/24/2019    and General Chemistry:    Lab Results   Component Value Date    NA 136 07/24/2019    K 4.6 07/24/2019    CL 96 07/24/2019    GAP 26 07/24/2019    BUN 68 07/24/2019    CR 3.51 07/24/2019    GLU 188 07/24/2019    CA 9.3 07/24/2019    ALBUMIN 3.3 07/24/2019    LACTIC 9.8 07/24/2019    OBSCA 1.17 07/24/2019    MG 2.4 07/24/2019    TOTBILI 1.1 07/24/2019     Glucose: (!) 188 (07/24/19 1942)  POC Glucose (Download): (!) 188 (07/24/19 2216)  Pertinent radiology reviewed.       CTA head:   1. ?No significant stenosis, aneurysm, or AVM.   2. ?Prosthetic left globe.     CTA neck:   1.  No significant stenosis, aneurysm, or AVM.   2. ?Cervical spine DISH.   3. ?Subtle nondisplaced fracture of the anterior right third rib noted     ____________________  Wess Botts, APRN-NP  Vascular Neurology [phone] or [facility]

## 2019-07-25 NOTE — Progress Notes
I saw this patient today upon admission to the CICU. I have reviewed all pertinent data, including labs, operative reports, radiology, vital signs, neurologic assessments, nursing notes, respiratory therapy data, intake and output, and consult notes and have devised a plan based on the aforementioned data. This patient is critically ill and is at risk for life threatening deterioration necessitating complex medical decision making and ongoing provision of ICU care for close monitoring.      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 He was initially diagnosed with pneumonia and given IV fluids, but over the course of 24 hours his renal function rapidly declined and his LFT's increased. Out of concern for cardiogenic shock, he was transferred to [facility] for further care. Upon his arrival, he was noted to be in slow ventricular tachycardia with an almost sine wave morphology and his clinical status rapidly deteriorated resulting in profound cardiogenic shock.     Assessment & Plan:  Neuro: Sedated on dexmedetomidine. Was awake and interactive prior to intubation.   CV: End-stage decompensated biventricular heart failure on maximal medical therapy showing signs of severe end-organ dysfunction. His urine output has ceased and his liver appears to be failing. He is on heroic doses of vasopressors and inotropes. His mixed venous is remarkably low and his cardiac index is calculated as 1.0. He has been brought to the cath lab for IABP placement. He is not a candidate for further mechanical support at this time. He was seen by EP for his slow VT and his device settings were changed. He underwent 5 minutes of CPR for pulseless VT requiring multiple shocks. He is grossly volume overloaded on POCUS and PA-Catheter and will require CRRT.   Pulmonary: Intubated for acidosis and airway protection. Oxygenating well. Continue mechanical ventilatory support. Had a large right hydropneumothorax after chest compressions for which we placed a percutaneous pigtail catheter with good drainage of his hydrothorax and expansion of his right lung. His chest tube has an air leak. Continue at -20 cm H2O wall suction.   Renal: Anuric renal failure despite large doses of diuretics. CRRT will be initiated once he's back from the cath lab. He is profoundly acidotic and is on a bicarbonate infusion. Nephrology is following, their assistance is appreciated. Has a history of underlying renal disease. Had profound hyperkalemia upon admission that likely contributed to slow VT, this has resolved with medical therapy.   ID: No evidence of active infection.   GI: NPO. Fulminant congestive hepatopathy and shock liver.   Heme: INR 3.2 due to hepatic failure  Endocrine: Insulin infusion for hyperkalemia, expect that as his liver failure progresses he will become hypoglycemic.     PPx:  - DVT: INR 3.2  - GI: PPI  - VAP: CHX  - Bowel Regimen: senna/docusate, milk of mag, miralax  - Glucose Control: Insulin dirp  - IVF: -   - Electrolytes: Goal K > 4, Mg > 2  - Nutrition: NPO  - LDA: R IJ Introducer with PA-Catheter, L IJ Hemodialysis Catheter, R femoral central venous line, R radial arterial line, ET tube, R pigtail chest tube, Foley, OG tube, PIVs

## 2019-07-25 NOTE — Progress Notes
2000 Xray at bedside. Swan pulled back  2cm. Locked at 55. CXray reviewed. Pulled back by Tayeb. Locked at 54. Okay to use lines.

## 2019-07-25 NOTE — Progress Notes
Patient arrived to room HC909 via cart accompanied by EMS. Patient transferred to the bed with assistance. Bedside safety checks completed. Initial patient assessment completed. Refer to flowsheet for details.    Admission skin assessment completed with: Gentry Roch, RN    Pressure injury present on arrival?: Yes    1. Head/Face/Neck: Yes -> stage 1 to back of scalp  2. Trunk/Back: No  3. Upper Extremities: No  4. Lower Extremities: No  5. Pelvic/Coccyx: Yes -> stage 1 to coccyx  6. Assessed for device associated injury? Yes  7. Malnutrition Screening Tool (Nursing Nutrition Assessment) Completed? Yes    See Doc Flowsheet for additional wound details.     INTERVENTIONS:

## 2019-07-26 ENCOUNTER — Inpatient Hospital Stay: Admit: 2019-07-26 | Discharge: 2019-07-26 | Payer: MEDICARE

## 2019-07-26 MED ORDER — KETAMINE 500MG/250ML IN D5W IV DRIP
.2 mg/kg/h | INTRAVENOUS | 0 refills | Status: DC
Start: 2019-07-26 — End: 2019-07-27
  Administered 2019-07-27 (×2): 0.2 mg/kg/h via INTRAVENOUS

## 2019-07-26 MED ORDER — POTASSIUM CHLORIDE 20 MEQ/15 ML PO LIQD
40 meq | Freq: Once | NASOGASTRIC | 0 refills | Status: CP
Start: 2019-07-26 — End: ?
  Administered 2019-07-26: 16:00:00 40 meq via NASOGASTRIC

## 2019-07-26 MED ORDER — CRRT CITRATE DIALYSATE SOLUTION 5000ML
INTRAVENOUS_CENTRAL | 0 refills | Status: DC
Start: 2019-07-26 — End: 2019-07-29
  Administered 2019-07-26 – 2019-07-29 (×20): 5000.000 mL via INTRAVENOUS_CENTRAL

## 2019-07-26 MED ORDER — BIVALIRUDIN IV DRIP
0-.25 mg/kg/h | INTRAVENOUS | 0 refills | Status: DC
Start: 2019-07-26 — End: 2019-07-28
  Administered 2019-07-26 (×2): 0.02 mg/kg/h via INTRAVENOUS

## 2019-07-26 MED ORDER — THIAMINE HCL (VITAMIN B1) 100 MG/ML IJ SOLN
100 mg | Freq: Every day | INTRAVENOUS | 0 refills | Status: DC
Start: 2019-07-26 — End: 2019-07-26

## 2019-07-26 MED ORDER — CRRT CITRATE REPLACEMENT SOLN-POST 5000ML
INTRAVENOUS_CENTRAL | 0 refills | Status: DC
Start: 2019-07-26 — End: 2019-07-29
  Administered 2019-07-26 – 2019-07-29 (×12): 5000.000 mL via INTRAVENOUS_CENTRAL

## 2019-07-26 MED ORDER — MAGNESIUM SULFATE IN D5W 1 GRAM/100 ML IV PGBK
1 g | Freq: Once | INTRAVENOUS | 0 refills | Status: CP
Start: 2019-07-26 — End: ?
  Administered 2019-07-26: 19:00:00 1 g via INTRAVENOUS

## 2019-07-26 MED ORDER — THIAMINE HCL (VITAMIN B1) 100 MG/ML IJ SOLN
100 mg | Freq: Every day | INTRAVENOUS | 0 refills | Status: CP
Start: 2019-07-26 — End: ?
  Administered 2019-07-26 – 2019-07-28 (×3): 100 mg via INTRAVENOUS

## 2019-07-26 MED ORDER — POTASSIUM CHLORIDE 20 MEQ/15 ML PO LIQD
60 meq | Freq: Once | NASOGASTRIC | 0 refills | Status: CP
Start: 2019-07-26 — End: ?
  Administered 2019-07-26: 19:00:00 60 meq via NASOGASTRIC

## 2019-07-27 ENCOUNTER — Inpatient Hospital Stay: Admit: 2019-07-27 | Discharge: 2019-07-27 | Payer: MEDICARE

## 2019-07-27 MED ORDER — MAGNESIUM SULFATE IN D5W 1 GRAM/100 ML IV PGBK
1 g | INTRAVENOUS | 0 refills | Status: CP
Start: 2019-07-27 — End: ?
  Administered 2019-07-27: 14:00:00 1 g via INTRAVENOUS

## 2019-07-27 MED ORDER — AMIODARONE 200 MG PO TAB
400 mg | Freq: Two times a day (BID) | NASOGASTRIC | 0 refills | Status: DC
Start: 2019-07-27 — End: 2019-07-31
  Administered 2019-07-27 – 2019-07-31 (×9): 400 mg via NASOGASTRIC

## 2019-07-27 MED ORDER — LORAZEPAM 2 MG/ML IJ SOLN
2 mg | Freq: Once | INTRAVENOUS | 0 refills | Status: CP
Start: 2019-07-27 — End: ?
  Administered 2019-07-27: 07:00:00 2 mg via INTRAVENOUS

## 2019-07-27 MED ORDER — INSULIN ASPART 100 UNIT/ML SC FLEXPEN
0-12 [IU] | Freq: Before meals | SUBCUTANEOUS | 0 refills | Status: DC
Start: 2019-07-27 — End: 2019-07-28

## 2019-07-27 MED ORDER — AMIODARONE(#) 5MG/ML PO SUSP
400 mg | Freq: Two times a day (BID) | ORAL | 0 refills | Status: DC
Start: 2019-07-27 — End: 2019-07-27

## 2019-07-27 MED ORDER — MAGNESIUM SULFATE IN D5W 1 GRAM/100 ML IV PGBK
1 g | INTRAVENOUS | 0 refills | Status: CP
Start: 2019-07-27 — End: ?
  Administered 2019-07-27: 15:00:00 1 g via INTRAVENOUS

## 2019-07-28 ENCOUNTER — Inpatient Hospital Stay: Admit: 2019-07-28 | Discharge: 2019-07-28 | Payer: MEDICARE

## 2019-07-28 MED ORDER — LACTATED RINGERS IV SOLP
500 mL | INTRAVENOUS | 0 refills | Status: DC
Start: 2019-07-28 — End: 2019-07-29

## 2019-07-28 MED ORDER — INSULIN ASPART 100 UNIT/ML SC FLEXPEN
0-24 [IU] | Freq: Before meals | SUBCUTANEOUS | 0 refills | Status: DC
Start: 2019-07-28 — End: 2019-07-31

## 2019-07-28 MED ORDER — MAGNESIUM SULFATE IN D5W 1 GRAM/100 ML IV PGBK
1 g | INTRAVENOUS | 0 refills | Status: CP
Start: 2019-07-28 — End: ?
  Administered 2019-07-28: 21:00:00 1 g via INTRAVENOUS

## 2019-07-28 MED ORDER — POLYETHYLENE GLYCOL 3350 17 GRAM PO PWPK
1 | Freq: Two times a day (BID) | ORAL | 0 refills | Status: DC
Start: 2019-07-28 — End: 2019-07-31
  Administered 2019-07-28 – 2019-07-31 (×7): 17 g via ORAL

## 2019-07-28 MED ORDER — LINEZOLID IN DEXTROSE 5% 600 MG/300 ML IV PGBK
600 mg | Freq: Two times a day (BID) | INTRAVENOUS | 0 refills | Status: DC
Start: 2019-07-28 — End: 2019-07-29

## 2019-07-28 MED ORDER — POTASSIUM CHLORIDE 20 MEQ/15 ML PO LIQD
30 meq | Freq: Once | ORAL | 0 refills | Status: CP
Start: 2019-07-28 — End: ?
  Administered 2019-07-28: 22:00:00 30 meq via ORAL

## 2019-07-28 MED ORDER — VANCOMYCIN PHARMACY TO MANAGE
1 | 0 refills | Status: DC
Start: 2019-07-28 — End: 2019-07-31

## 2019-07-28 MED ORDER — PANCRELIPASE-SODIUM BICARBONATE 20,880 K UNIT-650 MG
NASOGASTRIC | 0 refills | Status: DC | PRN
Start: 2019-07-28 — End: 2019-08-02

## 2019-07-28 MED ORDER — PIPERACILLIN/TAZOBACTAM 3.375 G/100ML NS IVPB (MB+)
3.375 g | INTRAVENOUS | 0 refills | Status: DC
Start: 2019-07-28 — End: 2019-07-31
  Administered 2019-07-29 – 2019-07-31 (×22): 3.375 g via INTRAVENOUS

## 2019-07-28 MED ORDER — VANCOMYCIN 1G/250ML D5W IVPB (VIAL2BAG)
15 mg/kg | INTRAVENOUS | 0 refills | Status: DC
Start: 2019-07-28 — End: 2019-07-31
  Administered 2019-07-30 – 2019-07-31 (×4): 1000 mg via INTRAVENOUS

## 2019-07-28 MED ORDER — INSULIN ASPART 100 UNIT/ML SC FLEXPEN
3 [IU] | Freq: Three times a day (TID) | SUBCUTANEOUS | 0 refills | Status: DC
Start: 2019-07-28 — End: 2019-07-31

## 2019-07-28 MED ORDER — PROPOFOL 10 MG/ML IV EMUL
5-30 ug/kg/min | INTRAVENOUS | 0 refills | Status: DC
Start: 2019-07-28 — End: 2019-07-31

## 2019-07-28 MED ORDER — EPINEPHRINE 64MCG/ML IN D5W IV DRIP (QUAD CONC)
.1 ug/kg/min | INTRAVENOUS | 0 refills | Status: DC
Start: 2019-07-28 — End: 2019-07-29
  Administered 2019-07-29 (×2): 0.1 ug/kg/min via INTRAVENOUS

## 2019-07-28 MED ORDER — VANCOMYCIN 1,250 MG IVPB
1250 mg | Freq: Once | INTRAVENOUS | 0 refills | Status: CP
Start: 2019-07-28 — End: ?
  Administered 2019-07-29 (×2): 1250 mg via INTRAVENOUS

## 2019-07-28 MED ORDER — MAGNESIUM SULFATE IN D5W 1 GRAM/100 ML IV PGBK
1 g | INTRAVENOUS | 0 refills | Status: CP
Start: 2019-07-28 — End: ?
  Administered 2019-07-28: 22:00:00 1 g via INTRAVENOUS

## 2019-07-28 MED ADMIN — PROPOFOL 10 MG/ML IV EMUL [11150]: 10 ug/kg/min | INTRAVENOUS | @ 18:00:00 | Stop: 2019-07-28 | NDC 25021060851

## 2019-07-29 ENCOUNTER — Inpatient Hospital Stay: Admit: 2019-07-29 | Discharge: 2019-07-29 | Payer: MEDICARE

## 2019-07-29 MED ORDER — SODIUM PHOSPHATE IVPB
24 MMOL | INTRAVENOUS | 0 refills | Status: DC | PRN
Start: 2019-07-29 — End: 2019-07-29

## 2019-07-29 MED ORDER — CITRATE DEXTROSE SOLUTION MISC SOLN
1000 mL | 0 refills | Status: DC
Start: 2019-07-29 — End: 2019-07-31
  Administered 2019-07-29 – 2019-07-30 (×8): 1000 mL

## 2019-07-29 MED ORDER — SODIUM PHOSPHATE IVPB
16 MMOL | INTRAVENOUS | 0 refills | Status: DC | PRN
Start: 2019-07-29 — End: 2019-07-29

## 2019-07-29 MED ORDER — CRRT HEPARIN DIALYSATE SOLN 5000ML
INTRAVENOUS_CENTRAL | 0 refills | Status: DC
Start: 2019-07-29 — End: 2019-07-29

## 2019-07-29 MED ORDER — SODIUM PHOSPHATE IVPB
16 MMOL | INTRAVENOUS | 0 refills | Status: DC | PRN
Start: 2019-07-29 — End: 2019-07-31

## 2019-07-29 MED ORDER — CRRT HEPARIN REPLACEMENT SOLN-POST 5000ML
INTRAVENOUS_CENTRAL | 0 refills | Status: DC
Start: 2019-07-29 — End: 2019-07-29

## 2019-07-29 MED ORDER — SODIUM CHLORIDE 0.9 % IV SOLP
1000-2000 mL | 0 refills | Status: DC | PRN
Start: 2019-07-29 — End: 2019-07-31

## 2019-07-29 MED ORDER — HEPARIN (PORCINE) 20,000 UNIT/250 ML IV INFUSION
0-2000 [IU]/h | INTRAVENOUS | 0 refills | Status: DC
Start: 2019-07-29 — End: 2019-07-30
  Administered 2019-07-29 (×2): 1110 [IU]/h via INTRAVENOUS
  Administered 2019-07-30 (×2): 910 [IU]/h via INTRAVENOUS

## 2019-07-29 MED ORDER — CALCIUM CHLORIDE 8G/NACL 0.9% IV SOLN 1000ML
8 g | INTRAVENOUS | 0 refills | Status: DC
Start: 2019-07-29 — End: 2019-07-31
  Administered 2019-07-29 – 2019-07-30 (×6): 8 g via INTRAVENOUS

## 2019-07-29 MED ORDER — SODIUM PHOSPHATE IVPB
24 MMOL | INTRAVENOUS | 0 refills | Status: DC | PRN
Start: 2019-07-29 — End: 2019-07-31

## 2019-07-29 MED ORDER — CRRT CITRATE REPLACEMENT SOLN-POST 5000ML
INTRAVENOUS_CENTRAL | 0 refills | Status: DC
Start: 2019-07-29 — End: 2019-07-31
  Administered 2019-07-29 – 2019-07-31 (×4): 5000.000 mL via INTRAVENOUS_CENTRAL

## 2019-07-29 MED ORDER — SODIUM PHOSPHATE IVPB
30 MMOL | INTRAVENOUS | 0 refills | Status: DC | PRN
Start: 2019-07-29 — End: 2019-07-31

## 2019-07-29 MED ORDER — SODIUM CHLORIDE 0.9 % IV SOLP
1000-2000 mL | 0 refills | Status: DC | PRN
Start: 2019-07-29 — End: 2019-07-29

## 2019-07-29 MED ORDER — CALCIUM GLUCONATE 1G/100ML IVPB (MB+)
1 g | INTRAVENOUS | 0 refills | Status: DC | PRN
Start: 2019-07-29 — End: 2019-07-31

## 2019-07-29 MED ORDER — HEPARIN (PORCINE) IN 5 % DEX 20,000 UNIT/500 ML (40 UNIT/ML) IV SOLP
0-2000 [IU]/h | INTRAVENOUS | 0 refills | Status: DC
Start: 2019-07-29 — End: 2019-07-29

## 2019-07-29 MED ORDER — SODIUM PHOSPHATE IVPB
30 MMOL | INTRAVENOUS | 0 refills | Status: DC | PRN
Start: 2019-07-29 — End: 2019-07-29

## 2019-07-29 MED ORDER — MAGNESIUM SULFATE IN D5W 1 GRAM/100 ML IV PGBK
1 g | Freq: Once | INTRAVENOUS | 0 refills | Status: CP
Start: 2019-07-29 — End: ?
  Administered 2019-07-30: 05:00:00 1 g via INTRAVENOUS

## 2019-07-29 MED ORDER — CRRT HEPARIN REPLACEMENT SOLN-PRE 5000ML
INTRAVENOUS_CENTRAL | 0 refills | Status: DC
Start: 2019-07-29 — End: 2019-07-29

## 2019-07-29 MED ORDER — CRRT CITRATE DIALYSATE SOLUTION 5000ML
INTRAVENOUS_CENTRAL | 0 refills | Status: DC
Start: 2019-07-29 — End: 2019-07-31
  Administered 2019-07-29 – 2019-07-31 (×10): 5000.000 mL via INTRAVENOUS_CENTRAL

## 2019-07-30 ENCOUNTER — Inpatient Hospital Stay: Admit: 2019-07-30 | Discharge: 2019-07-30 | Payer: MEDICARE

## 2019-07-30 ENCOUNTER — Encounter: Admit: 2019-07-30 | Discharge: 2019-07-30 | Payer: MEDICARE

## 2019-07-30 MED ORDER — SODIUM PHOSPHATE IVPB
30 MMOL | INTRAVENOUS | 0 refills | Status: DC | PRN
Start: 2019-07-30 — End: 2019-07-31

## 2019-07-30 MED ORDER — BISACODYL 10 MG RE SUPP
10 mg | Freq: Every day | RECTAL | 0 refills | Status: DC | PRN
Start: 2019-07-30 — End: 2019-07-30

## 2019-07-30 MED ORDER — LACTULOSE 10 GRAM/15 ML PO LIQUID GROUP
30 mL | Freq: Three times a day (TID) | ORAL | 0 refills | Status: DC
Start: 2019-07-30 — End: 2019-07-31
  Administered 2019-07-30 – 2019-07-31 (×4): 20 g via ORAL

## 2019-07-30 MED ORDER — SODIUM PHOSPHATE IVPB
16 MMOL | INTRAVENOUS | 0 refills | Status: DC | PRN
Start: 2019-07-30 — End: 2019-07-31

## 2019-07-30 MED ORDER — SODIUM PHOSPHATE IVPB
24 MMOL | INTRAVENOUS | 0 refills | Status: DC | PRN
Start: 2019-07-30 — End: 2019-07-31
  Administered 2019-07-31 (×2): 24 mmol via INTRAVENOUS

## 2019-07-30 MED ORDER — CRRT CITRATE REPLACEMENT SOLN-POST 5000ML
INTRAVENOUS_CENTRAL | 0 refills | Status: DC
Start: 2019-07-30 — End: 2019-07-31
  Administered 2019-07-31 (×2): 5000.000 mL via INTRAVENOUS_CENTRAL

## 2019-07-30 MED ORDER — CALCIUM GLUCONATE 1G/100ML IVPB (MB+)
1 g | INTRAVENOUS | 0 refills | Status: DC | PRN
Start: 2019-07-30 — End: 2019-07-31

## 2019-07-30 MED ORDER — CITRATE DEXTROSE SOLUTION MISC SOLN
1000 mL | 0 refills | Status: DC
Start: 2019-07-30 — End: 2019-07-31
  Administered 2019-07-31 (×3): 1000 mL

## 2019-07-30 MED ORDER — MIDODRINE 5 MG PO TAB
15 mg | ORAL | 0 refills | Status: DC
Start: 2019-07-30 — End: 2019-07-31
  Administered 2019-07-30 – 2019-07-31 (×4): 15 mg via ORAL

## 2019-07-30 MED ORDER — CRRT CITRATE DIALYSATE SOLUTION 5000ML
INTRAVENOUS_CENTRAL | 0 refills | Status: DC
Start: 2019-07-30 — End: 2019-07-31
  Administered 2019-07-31 (×4): 5000.000 mL via INTRAVENOUS_CENTRAL

## 2019-07-30 MED ORDER — SODIUM CHLORIDE 0.9 % IV SOLP
1000-2000 mL | 0 refills | Status: DC | PRN
Start: 2019-07-30 — End: 2019-07-31

## 2019-07-30 MED ORDER — CALCIUM CHLORIDE 8G/NACL 0.9% IV SOLN 1000ML
8 g | INTRAVENOUS | 0 refills | Status: DC
Start: 2019-07-30 — End: 2019-07-31
  Administered 2019-07-31 (×4): 8 g via INTRAVENOUS

## 2019-07-31 ENCOUNTER — Inpatient Hospital Stay: Admit: 2019-07-31 | Discharge: 2019-07-31 | Payer: MEDICARE

## 2019-07-31 MED ORDER — MIDAZOLAM 1 MG/ML IJ SOLN
2 mg | INTRAVENOUS | 0 refills | Status: DC | PRN
Start: 2019-07-31 — End: 2019-08-02
  Administered 2019-07-31 – 2019-08-01 (×5): 2 mg via INTRAVENOUS

## 2019-07-31 MED ORDER — GLYCOPYRROLATE 0.2 MG/ML IJ SOLN
.4-.8 mg | INTRAVENOUS | 0 refills | Status: DC | PRN
Start: 2019-07-31 — End: 2019-08-02
  Administered 2019-08-01: 04:00:00 0.8 mg via INTRAVENOUS
  Administered 2019-08-01: 12:00:00 0.4 mg via INTRAVENOUS

## 2019-07-31 MED ORDER — LORAZEPAM 20MG/200ML IV DRIP (STD CONC)
1 mg/h | INTRAVENOUS | 0 refills | Status: DC
Start: 2019-07-31 — End: 2019-07-31
  Administered 2019-07-31 (×2): 1 mg/h via INTRAVENOUS

## 2019-07-31 MED ORDER — LORAZEPAM 2 MG/ML IJ SOLN
2 mg | Freq: Once | INTRAVENOUS | 0 refills | Status: CP
Start: 2019-07-31 — End: ?

## 2019-07-31 MED ORDER — MORPHINE 2 MG/ML IV SYRG
2-4 mg | INTRAVENOUS | 0 refills | Status: DC | PRN
Start: 2019-07-31 — End: 2019-08-02
  Administered 2019-07-31: 23:00:00 4 mg via INTRAVENOUS
  Administered 2019-08-01: 12:00:00 2 mg via INTRAVENOUS
  Administered 2019-08-01: 21:00:00 4 mg via INTRAVENOUS
  Administered 2019-08-01 (×2): 2 mg via INTRAVENOUS
  Administered 2019-08-01: 23:00:00 4 mg via INTRAVENOUS

## 2019-07-31 MED ORDER — MILRINONE IN 5 % DEXTROSE 20 MG/100 ML (200 MCG/ML) IV PGBK
.125 ug/kg/min | INTRAVENOUS | 0 refills | Status: DC
Start: 2019-07-31 — End: 2019-07-31
  Administered 2019-07-31: 11:00:00 0.0625 ug/kg/min via INTRAVENOUS

## 2019-07-31 MED ORDER — CRRT CITRATE REPLACEMENT SOLN-POST 5000ML
INTRAVENOUS_CENTRAL | 0 refills | Status: DC
Start: 2019-07-31 — End: 2019-07-31

## 2019-07-31 MED ORDER — FENTANYL CITRATE (PF) 50 MCG/ML IJ SOLN
100 ug | Freq: Once | INTRAVENOUS | 0 refills | Status: CP
Start: 2019-07-31 — End: ?

## 2019-07-31 MED ORDER — CRRT CITRATE DIALYSATE SOLUTION 5000ML
INTRAVENOUS_CENTRAL | 0 refills | Status: DC
Start: 2019-07-31 — End: 2019-07-31

## 2019-07-31 MED ORDER — HALOPERIDOL LACTATE 5 MG/ML IJ SOLN
1 mg | INTRAVENOUS | 0 refills | Status: DC | PRN
Start: 2019-07-31 — End: 2019-08-02
  Administered 2019-07-31: 23:00:00 1 mg via INTRAVENOUS

## 2019-07-31 MED ORDER — BISACODYL 10 MG RE SUPP
10 mg | Freq: Every day | RECTAL | 0 refills | Status: DC
Start: 2019-07-31 — End: 2019-08-02

## 2019-07-31 MED ADMIN — FENTANYL CITRATE (PF) 50 MCG/ML IJ SOLN [3037]: 100 ug | INTRAVENOUS | @ 18:00:00 | Stop: 2019-07-31 | NDC 00409909412

## 2019-07-31 MED ADMIN — MIDAZOLAM 1 MG/ML IJ SOLN [10607]: 2 mg | INTRAVENOUS | @ 18:00:00 | Stop: 2019-07-31 | NDC 00409230516

## 2019-07-31 MED ADMIN — LORAZEPAM 2 MG/ML IJ SOLN [10467]: 2 mg | INTRAVENOUS | @ 16:00:00 | Stop: 2019-07-31 | NDC 00641604801

## 2019-08-03 ENCOUNTER — Encounter: Admit: 2019-08-03 | Discharge: 2019-08-03 | Payer: MEDICARE

## 2019-08-03 NOTE — Telephone Encounter
Received call from Patty with [employer] reporting death certificate is now online for Dr. Ames Dura to complete. Can call back to [phone] for any further questions.

## 2019-08-12 DEATH — deceased
